# Patient Record
Sex: Female | Born: 1965
Health system: Southern US, Community
[De-identification: ages and names within clinical notes are randomized; demographics above are authoritative.]

## PROBLEM LIST (undated history)

## (undated) DIAGNOSIS — N6091 Unspecified benign mammary dysplasia of right breast: Secondary | ICD-10-CM

## (undated) DIAGNOSIS — F329 Major depressive disorder, single episode, unspecified: Secondary | ICD-10-CM

## (undated) DIAGNOSIS — F32A Depression, unspecified: Secondary | ICD-10-CM

## (undated) DIAGNOSIS — Z803 Family history of malignant neoplasm of breast: Secondary | ICD-10-CM

## (undated) DIAGNOSIS — F419 Anxiety disorder, unspecified: Secondary | ICD-10-CM

## (undated) DIAGNOSIS — M5030 Other cervical disc degeneration, unspecified cervical region: Secondary | ICD-10-CM

## (undated) DIAGNOSIS — E039 Hypothyroidism, unspecified: Secondary | ICD-10-CM

## (undated) DIAGNOSIS — Z973 Presence of spectacles and contact lenses: Secondary | ICD-10-CM

## (undated) DIAGNOSIS — R252 Cramp and spasm: Secondary | ICD-10-CM

## (undated) HISTORY — DX: Unspecified benign mammary dysplasia of right breast: N60.91

## (undated) HISTORY — PX: DILATION AND CURETTAGE OF UTERUS: SHX78

## (undated) HISTORY — PX: KNEE ARTHROSCOPY: SUR90

## (undated) HISTORY — PX: BACK SURGERY: SHX140

## (undated) HISTORY — PX: TONSILLECTOMY: SUR1361

## (undated) HISTORY — DX: Family history of malignant neoplasm of breast: Z80.3

## (undated) HISTORY — PX: TUBAL LIGATION: SHX77

---

## 1991-02-10 HISTORY — PX: CARPAL TUNNEL RELEASE: SHX101

## 1997-07-25 ENCOUNTER — Other Ambulatory Visit: Admission: RE | Admit: 1997-07-25 | Discharge: 1997-07-25 | Payer: Self-pay | Admitting: Obstetrics and Gynecology

## 1998-01-22 ENCOUNTER — Other Ambulatory Visit: Admission: RE | Admit: 1998-01-22 | Discharge: 1998-01-22 | Payer: Self-pay | Admitting: Obstetrics and Gynecology

## 2001-06-28 ENCOUNTER — Other Ambulatory Visit: Admission: RE | Admit: 2001-06-28 | Discharge: 2001-06-28 | Payer: Self-pay | Admitting: Obstetrics and Gynecology

## 2002-07-31 ENCOUNTER — Other Ambulatory Visit: Admission: RE | Admit: 2002-07-31 | Discharge: 2002-07-31 | Payer: Self-pay | Admitting: Obstetrics and Gynecology

## 2002-08-07 ENCOUNTER — Encounter: Payer: Self-pay | Admitting: Obstetrics and Gynecology

## 2002-08-07 ENCOUNTER — Encounter: Admission: RE | Admit: 2002-08-07 | Discharge: 2002-08-07 | Payer: Self-pay | Admitting: Obstetrics and Gynecology

## 2002-08-21 ENCOUNTER — Encounter: Admission: RE | Admit: 2002-08-21 | Discharge: 2002-08-21 | Payer: Self-pay | Admitting: Obstetrics and Gynecology

## 2002-08-21 ENCOUNTER — Encounter (INDEPENDENT_AMBULATORY_CARE_PROVIDER_SITE_OTHER): Payer: Self-pay | Admitting: Specialist

## 2002-08-21 ENCOUNTER — Encounter: Payer: Self-pay | Admitting: Obstetrics and Gynecology

## 2002-09-29 ENCOUNTER — Ambulatory Visit (HOSPITAL_COMMUNITY): Admission: RE | Admit: 2002-09-29 | Discharge: 2002-09-29 | Payer: Self-pay | Admitting: Obstetrics and Gynecology

## 2003-02-10 HISTORY — PX: CERVICAL FUSION: SHX112

## 2004-02-15 ENCOUNTER — Other Ambulatory Visit: Admission: RE | Admit: 2004-02-15 | Discharge: 2004-02-15 | Payer: Self-pay | Admitting: Obstetrics and Gynecology

## 2004-12-01 ENCOUNTER — Ambulatory Visit (HOSPITAL_COMMUNITY): Admission: RE | Admit: 2004-12-01 | Discharge: 2004-12-01 | Payer: Self-pay | Admitting: Obstetrics and Gynecology

## 2004-12-01 ENCOUNTER — Encounter (INDEPENDENT_AMBULATORY_CARE_PROVIDER_SITE_OTHER): Payer: Self-pay | Admitting: *Deleted

## 2005-03-23 ENCOUNTER — Other Ambulatory Visit: Admission: RE | Admit: 2005-03-23 | Discharge: 2005-03-23 | Payer: Self-pay | Admitting: Obstetrics and Gynecology

## 2006-02-09 HISTORY — PX: TOTAL VAGINAL HYSTERECTOMY: SHX2548

## 2006-07-27 ENCOUNTER — Ambulatory Visit (HOSPITAL_COMMUNITY): Admission: RE | Admit: 2006-07-27 | Discharge: 2006-07-28 | Payer: Self-pay | Admitting: Obstetrics and Gynecology

## 2006-07-27 ENCOUNTER — Encounter (INDEPENDENT_AMBULATORY_CARE_PROVIDER_SITE_OTHER): Payer: Self-pay | Admitting: Obstetrics and Gynecology

## 2006-08-06 ENCOUNTER — Inpatient Hospital Stay (HOSPITAL_COMMUNITY): Admission: AD | Admit: 2006-08-06 | Discharge: 2006-08-08 | Payer: Self-pay | Admitting: Obstetrics and Gynecology

## 2008-10-29 ENCOUNTER — Emergency Department (HOSPITAL_BASED_OUTPATIENT_CLINIC_OR_DEPARTMENT_OTHER): Admission: EM | Admit: 2008-10-29 | Discharge: 2008-10-29 | Payer: Self-pay | Admitting: Emergency Medicine

## 2008-10-29 ENCOUNTER — Ambulatory Visit: Payer: Self-pay | Admitting: Radiology

## 2010-05-16 LAB — DIFFERENTIAL
Lymphocytes Relative: 34 % (ref 12–46)
Lymphs Abs: 2.5 10*3/uL (ref 0.7–4.0)
Monocytes Relative: 6 % (ref 3–12)
Neutro Abs: 4.1 10*3/uL (ref 1.7–7.7)
Neutrophils Relative %: 56 % (ref 43–77)

## 2010-05-16 LAB — BASIC METABOLIC PANEL
Calcium: 9.3 mg/dL (ref 8.4–10.5)
Chloride: 105 mEq/L (ref 96–112)
Creatinine, Ser: 0.7 mg/dL (ref 0.4–1.2)
GFR calc Af Amer: >60 mL/min (ref >60)
GFR calc non Af Amer: >60 mL/min (ref >60)

## 2010-05-16 LAB — CBC
RBC: 4.03 MIL/uL (ref 3.87–5.11)
WBC: 7.3 10*3/uL (ref 4.0–10.5)

## 2010-05-16 LAB — URINALYSIS, ROUTINE W REFLEX MICROSCOPIC
Glucose, UA: NEGATIVE mg/dL
Hgb urine dipstick: NEGATIVE
Specific Gravity, Urine: 1.029 (ref 1.005–1.030)

## 2010-06-24 NOTE — Op Note (Signed)
NAMEELVIE, PALOMO NO.:  [id number]   MEDICAL RECORD NO.:  [id number]          PATIENT TYPE:  AMB   LOCATION:  SDC                           FACILITY:  [facility]   PHYSICIAN:  Juluis Mire, M.D.   DATE OF BIRTH:  May 12, 1965   DATE OF PROCEDURE:  07/27/2006  DATE OF DISCHARGE:                               OPERATIVE REPORT   PREOPERATIVE DIAGNOSIS:  Uterine fibroids.   POSTOPERATIVE DIAGNOSIS:  Uterine fibroids.   OPERATIVE PROCEDURE:  Laparoscopic-assisted vaginal hysterectomy.   SURGEON:  Juluis Mire, M.D.   ASSISTANT:  Candice Camp, M.D.   ANESTHESIA:  General.   ESTIMATED BLOOD LOSS:  300-400 mL.   PACKS AND DRAINS:  None.   INTRAOPERATIVE BLOOD REPLACED:  None.   COMPLICATIONS:  None.   INDICATIONS:  Dictated in the history and physical.   Procedure as follows:  The patient was taken to the OR and placed in the  supine position.  After satisfactory level of general tracheal  anesthesia was obtained, the patient was placed in dorsal spine position  using the Allen stirrups.  At this point in time, the abdomen, perineum  and vagina were prepped out with Betadine.  The bladder was emptied by  in-and-out catheterization.  A Hulka was put in place.  The patient was  then draped in sterile field.  A subumbilical incision was made with a  knife and extended through subcutaneous tissue.  The fascia was entered  sharply and incision in the fascia extended laterally.  Muscles were  separated in the midline.  Peritoneum was entered sharply, and an open  laparoscopic trocar was put in place and secured.  The abdomen was  insufflated with carbon dioxide.  Laparoscope was introduced.  There was  no evidence of injury to adjacent organs.  A 5-mm trocar was put in  place in the suprapubic area under direct visualization.  Visualization  revealed the uterus to be enlarged with multiple fibroids.  Tubes and  ovaries were unremarkable.  Appendix was visualized and  noted to be  normal.  Upper abdomen including liver and tip of the gallbladder were  clear.  At this point in time using the gyrus, the right utero-ovarian  pedicle was cauterized incised.  The right round ligament was cauterized  incised.  We then went to the left side where the left utero-ovarian  pedicle was cauterized incised and the left round ligament was  cauterized incised.  Both adnexa right were adequately freed.   At this point in time, the abdomen was deflated of carbon dioxide.  Laparoscope was removed.  The patient's legs repositioned.  The Hulka  tenaculum was then taken out.  A weighted speculum was placed in vaginal  vault.  The cervix was grasped with Christella Hartigan tenaculum.  Cul-de-sac was  entered sharply.  Reflection of the vaginal mucosa anteriorly was  incised using the Bovie.  Uterosacral ligaments were clamped, cut, and  suture ligated with 0 Vicryl.  The bladder was dissected superiorly.  Paracervical tissue was clamped, cut and suture ligated with 0 Vicryl.  Using a clamp,  cut and tie technique with suture ligature of 9 Vicryl,  the parametrium was serially separated from the sides of the uterus.  Retractor was put in anteriorly to reflect the bladder superiorly.  We  then morcellated the cervix and the lower cervical stump from its  attachment to the uterus.  We continued morcellation until the fundus  delivered through the vagina.  The remaining tissue was then clamped and  cut, and the remaining part of the uterus passed off of the operative  field.  Total weight was 380 grams.  Uterus and cervix were sent to  pathology.  Pedicles secured with free ties of 0 Vicryl.  Uterosacral  plication stitch of 0 Vicryl was put in place and secured.  The vaginal  mucosa was reapproximated in the midline with interrupted sutures of 0  Monocryl.  At this point in time, Foley was placed to straight drain.  Clear urine was obtained.   The patient's legs were repositioned.   Laparoscope was reintroduced.  Any areas of bleeding were brought under control using cautery.  We  thoroughly irrigated the pelvis.  Once we had documented adequate  hemostasis, the abdomen was deflated of its carbon dioxide.  All trocars  were removed.  Subumbilical fascia closed with 2 figure-of-eights of 0  Vicryl, skin with interrupted subcuticulars of 4-0 Vicryl.  Suprapubic  incision was then closed with Dermabond.  The patient taken out of  dorsal lithotomy position.  Once alert and extubated, transferred to the  recovery room in good condition.  Sponge, instrument and needle count  reported as correct by the circulating nurse x2.  Foley catheter  remained clear.      Juluis Mire, M.D.  Electronically Signed     JSM/MEDQ  D:  07/27/2006  T:  07/27/2006  Job:  [id number]

## 2010-06-24 NOTE — H&P (Signed)
[name], [name]               ACCOUNT NO.:  [id number]   MEDICAL RECORD NO.:  [id number]          PATIENT TYPE:  INP   LOCATION:  9399                          FACILITY:  [facility]   PHYSICIAN:  Guy Sandifer. Henderson Cloud, M.D. DATE OF BIRTH:  1965/11/03   DATE OF ADMISSION:  08/06/2006  DATE OF DISCHARGE:                              HISTORY & PHYSICAL   CHIEF COMPLAINT:  Postoperative fever.   HISTORY OF PRESENT ILLNESS:  This patient is a 45 year old, married,  white female, G2, P2 who is status post laparoscopically assisted  vaginal hysterectomy with Dr. Arelia Sneddon on July 27, 2006. The procedure was  without complication. The patient was discharged home in the usual  manner. She was seen in the office on August 03, 2006. She had some  vaginal discomfort and bloating but was afebrile. On exam, it was felt  she may have a small vaginal cuff hematoma. She was placed on oral  ciprofloxacin. Since that time, she has had temperatures of 102. She has  taken ibuprofen and Tylenol but the fever has been recurrent. She is  taking some liquids but is unable to eat food. She is having bowel  movements and voiding well. No vomiting. She was then evaluated in the  office today where here temperature was 102.5. Pulse was 120. Her  urinalysis had large ketones.   PAST MEDICAL HISTORY:  1. Hypothyroidism.  2. Cervical dysplasia.   PAST SURGICAL HISTORY:  1. Bilateral tubal ligation.  2. Hysteroscopy.   MEDICATIONS:  Synthroid 0.88 mg daily.   ALLERGIES:  The patient reports allergy to penicillin as a child with  rash and fever. However, she has taken Keflex without difficulty.   OBSTETRICAL HISTORY:  Vaginal delivery x2.   FAMILY HISTORY:  Noncontributory.   SOCIAL HISTORY:  Denies tobacco, alcohol or drug abuse.   REVIEW OF SYSTEMS:  The patient complains of shaking chills and fever as  above. Denies shortness of breath, cough or chest pain.   PHYSICAL EXAMINATION:  The patient is mildly  diaphoretic.  LUNGS:  Are clear to auscultation.  HEART:  Regular rate and rhythm.  BACK:  Is without CVA tenderness.  BREASTS:  Are not examined.  ABDOMEN:  Is soft with positive bowel sounds. She has mild suprapubic  tenderness without rebound.  PELVIC:  On bimanual exam, she is quite tender at the cuff, and there is  some mass effect suggesting a possible hematoma.  EXTREMITIES:  Are grossly within normal limits.  NEUROLOGIC EXAM:  Grossly within normal limits.   ASSESSMENT:  Postoperative fever. Possible cuff cellulitis or hematoma  of the vaginal cuff.   PLAN:  Will admit to the hospital. Begin IV fluids, IV antibiotics and  further evaluation.      Guy Sandifer Henderson Cloud, M.D.  Electronically Signed     JET/MEDQ  D:  08/06/2006  T:  08/06/2006  Job:  [id number]

## 2010-06-24 NOTE — Discharge Summary (Signed)
NAMEJAYLEN, CLAUDE               ACCOUNT NO.:  [id number]   MEDICAL RECORD NO.:  [id number]          PATIENT TYPE:  OIB   LOCATION:  9318                          FACILITY:  [facility]   PHYSICIAN:  Juluis Mire, M.D.   DATE OF BIRTH:  04/04/1965   DATE OF ADMISSION:  07/27/2006  DATE OF DISCHARGE:  07/28/2006                               DISCHARGE SUMMARY   ADMISSION DIAGNOSIS:  Uterine fibroids.   DISCHARGE DIAGNOSIS:  Uterine fibroids.   OPERATIVE PROCEDURE:  Laparoscopic-assisted vaginal hysterectomy.   For complete history and physical, see dictated note.   COURSE IN THE HOSPITAL:  The patient underwent above-noted procedure.  Did quite well, discharged home first postoperative day, at that time  tolerating a regular diet, voiding without difficulty. Her incisions  were clear. Bowel sounds were active. She had no active bleeding.  Postoperative hemoglobin 11.9.   In terms of complications, none were encountered during stay in the  hospital. The patient was discharged home in stable condition.   DISPOSITION:  The patient is avoid heavy lifting, vaginal entry, driving  a car. Watch for signs of infection, nausea/vomiting, increasing  abdominal pain, active vaginal bleeding, signs of deep vein thrombosis  or pulmonary embolus. Followup in the office will be in one week. Sent  home on Tylox as needed for pain.      Juluis Mire, M.D.  Electronically Signed     JSM/MEDQ  D:  07/28/2006  T:  07/28/2006  Job:  [id number]

## 2010-06-24 NOTE — H&P (Signed)
Michelle [name], MENA NO.:  [id number]   MEDICAL RECORD NO.:  [id number]          PATIENT TYPE:  AMB   LOCATION:  SDC                           [facility]ACILITY:  [facility]   PHYSICIAN:  Juluis Mire, M.D.   DATE OF BIRTH:  01-31-1966   DATE OF ADMISSION:  07/27/2006  DATE OF DISCHARGE:                              HISTORY & PHYSICAL   The patient is a 45 year old, gravida 2, para 2, married female,  presents for laparoscopic-assisted vaginal hysterectomy, possible  bilateral salpingo-oophorectomy.   In relation to present admission, the patient's cycles have been  relatively regular.  She has complained of increasing menorrhagia.  She  has been on birth control pills for a period of time without  improvement.  We did do an ultrasound evaluation.  She had multiple  fibroids, largest measuring 6.2 x 5.3.  Left ovary had 2.6-cm cyst.  Since she did respond to birth control pills, we have discussed various  options.  After discussion of options, the patient decided to proceed  with laparoscopic-assisted vaginal hysterectomy.   IN TERMS OF ALLERGIES, SHE IS ALLERGIC TO PENICILLIN.   MEDICATIONS:  Include Synthroid, birth control pills and vitamins.   PAST MEDICAL HISTORY:  Usual childhood diseases.  No significant  sequelae.  She is on Synthroid for hypothyroidism.  She has a history of  cervical dysplasia treated with cryotherapy in the past.  She had  previous bilateral tubal ligation in 2004.  She has also had a history  of hysteroscopy, resection of polyps in 2006.   OBSTETRICAL HISTORY:  She has had two vaginal deliveries.   FAMILY HISTORY:  Noncontributory.   SOCIAL HISTORY:  Reveals no tobacco or alcohol use.   REVIEW OF SYSTEMS:  Noncontributory.   PHYSICAL EXAMINATION:  The patient is afebrile with stable vital signs.  HEENT EXAM:  The patient is normocephalic.  Pupils equal, round and  reactive to light and accommodation.  Extraocular movements were intact.  Sclerae and conjunctivae are clear.  Oropharynx clear.  NECK:  Without thyromegaly.  BREASTS:  No discrete masses.  LUNGS:  Clear.  CARDIOVASCULAR SYSTEM:  Regular rate without murmurs or gallops.  ABDOMINAL EXAM:  Is benign.  No mass, organomegaly or tenderness.  PELVIC:  Normal external genitalia.  Vaginal mucosa clear.  Cervix  unremarkable.  Uterus enlarged with multiple fibroids, approximately 8-9  weeks.  Adnexa unremarkable.  EXTREMITIES:  Trace edema.  NEUROLOGIC EXAM:  Grossly within normal limits.   IMPRESSION:  Menorrhagia secondary to uterine fibroids.   PLAN:  The patient will undergo laparoscopic-assisted vaginal  hysterectomy.  Ovaries were left in place per patient's request as they  appeared to be normal.  Potential risk of malignant transformation have  been discussed.  The risks of surgery explained including the risk of  infection.  Risk of hemorrhage that could require transfusion with the  risk of AIDS or hepatitis.  Risk of injury to adjacent organs including  bladder, bowel, ureters that could require further exploratory surgery.  Risk of deep venous thrombosis and pulmonary emboli.  The patient  expressed understanding of  indications and risks.      Juluis Mire, M.D.  Electronically Signed     JSM/MEDQ  D:  07/27/2006  T:  07/27/2006  Job:  [id number]

## 2010-06-24 NOTE — Discharge Summary (Signed)
NAMEHETHER, ANSELMO               ACCOUNT NO.:  [id number]   MEDICAL RECORD NO.:  [id number]          PATIENT TYPE:  INP   LOCATION:  9308                          FACILITY:  [facility]   PHYSICIAN:  Guy Sandifer. Henderson Cloud, M.D. DATE OF BIRTH:  10-21-1965   DATE OF ADMISSION:  08/06/2006  DATE OF DISCHARGE:  08/08/2006                               DISCHARGE SUMMARY   ADMITTING DIAGNOSES:  1. Status post laparoscopically-assisted vaginal hysterectomy      approximately 2 weeks ago.  2. Fever.   DISCHARGE DIAGNOSES:  1. Status post laparoscopically-assisted vaginal hysterectomy      approximately 2 weeks ago.  2. Fever.   REASON FOR ADMISSION:  This patient is a 45 year old married white  female, status post laparoscopically-assisted vaginal hysterectomy  approximately 2 weeks prior to presentation.  She was evaluated in the  office approximately 3 to 4 days ago.  At that time, she had a probable  vaginal cuff hematoma that was palpated.  She was placed on Cipro,  although she was afebrile at that point in the office.  For the next  three nights, she had fevers of approximately 102 degrees.  They would  defervesce with antipyretics, but then return.  She was complaining of  body aches.  She was tolerating liquids but had decreased appetite.  She  was having flatus, bowel movements and voiding without difficulty.  On  evaluation, temperature was 102.5.  Hemoglobin 11.3, white count 20.4,  platelets 247,000, and neutrophils were 97.   HOSPITAL COURSE:  The patient admitted to hospital.  She underwent an  abdominopelvic CT, which revealed an approximately 6-cm mass of the  vaginal cuff, consistent with hematoma versus abscess.  She was placed  on intravenous Cefotan, which she tolerated well.  Temperature  defervesced over the first 24 hours.  On August 07, 2006, white count is  15.5, hemoglobin 10.0 and neutrophils are 86%.  She continued to improve  and on the day of admission has been  afebrile for greater than 24 hours.  She is feeling better, tolerating a regular diet and ambulating without  difficulty.  Urine and blood cultures are pending.   CONDITION ON DISCHARGE:  Good.  Diet regular as tolerated.   ACTIVITY:  No vaginal entry.  No lifting or operation of automobiles.  She is to call the office for problems including not limited to  temperature of 101 degrees, increasing pain or persistent nausea,  vomiting.   MEDICATIONS:  1. Levaquin 750 mg, #7 one p.o. daily.  2. Flagyl ER 750 mg, #7 one p.o. daily.  She can resume her pain meds that she was taking postoperatively as  needed.   FOLLOW-UP:  Is in the office tomorrow with Dr. Arelia Sneddon.      Guy Sandifer Henderson Cloud, M.D.  Electronically Signed     JET/MEDQ  D:  08/08/2006  T:  08/08/2006  Job:  [id number]

## 2010-06-27 NOTE — Op Note (Signed)
Michelle [name], Michelle [name]                         ACCOUNT NO.:  [id number]   MEDICAL RECORD NO.:  [id number]                   PATIENT TYPE:  AMB   LOCATION:  SDC                                  FACILITY:  [facility]   PHYSICIAN:  Juluis Mire, M.D.                DATE OF BIRTH:  April 28, 1965   DATE OF PROCEDURE:  09/29/2002  DATE OF DISCHARGE:                                 OPERATIVE REPORT   PREOPERATIVE DIAGNOSIS:  Multiparity, desires sterility.   POSTOPERATIVE DIAGNOSES:  1. Multiparity, desires sterility.  2. Endometriosis.   PROCEDURES:  1. Diagnostic laparoscopy.  2. Bilateral tubal fulguration.  3. Cautery of endometriotic implants.   SURGEON:  Juluis Mire, M.D.   ANESTHESIA:  General.   ESTIMATED BLOOD LOSS:  Minimal.   PACKS AND DRAINS:  None.   INTRAOPERATIVE BLOOD REPLACED:  None.   COMPLICATIONS:  None.   INDICATIONS:  Dictated in history and physical.   DESCRIPTION OF PROCEDURE:  Patient taken to the OR, placed in the supine  position.  After a satisfactory level of general endotracheal anesthesia  obtained, the patient was placed in the dorsal lithotomy position using the  Allen stirrups.  The abdomen, perineum, and vagina were prepped out with  Betadine.  The bladder was emptied by in-and-out catheterization.  A  speculum was placed in the vaginal vault.  The Hulka tenaculum was secured  and the speculum was then removed.  The patient draped out as a sterile  field.  A subumbilical incision made with a knife.  The Veress needle was  introduced into the abdominal cavity.  The abdomen was inflated with carbon  dioxide.  Then operating laparoscope was introduced.  There was no evidence  of injury to adjacent organs.  Visualization revealed the uterus to be upper  limits of normal size.  Tubes and ovaries were unremarkable.  She did have  an implant of endometriosis on the left pelvic sidewall cul-de-sac area.  The appendix was visualized and noted to be  normal.  The upper abdomen,  including the liver and tip of the gallbladder, was clear.  Using the  bipolar, a 3 cm segment of each fallopian tube was cauterized.  Cautery was  continued until resistance read 0 on the meter.  Coagulation did extend out  to the mesosalpinx.  The same segment of tube was re-coagulated to  completely desiccate the tube.  At the end of the procedure both tubes were  adequately coagulated and there was no evidence of injury to adjacent organs  or active bleeding.  The abdomen was deflated of its carbon dioxide, the  laparoscope and trocar were removed.  The  subumbilical incision closed with interrupted subcuticulars of 4-0 Vicryl.  The Hulka tenaculum was then removed and the patient taken out of the dorsal  lithotomy position, once alert and extubated transferred to the recovery  room in good condition.  Juluis Mire, M.D.    JSM/MEDQ  D:  09/29/2002  T:  09/29/2002  Job:  [id number]

## 2010-06-27 NOTE — H&P (Signed)
NAMEJARROD, BODKINS NO.:  [id number]   MEDICAL RECORD NO.:  [id number]          PATIENT TYPE:  AMB   LOCATION:                                [facility]ILITY[facility]   PHYSICIAN:  Juluis Mire, M.D.   DATE OF BIRTH:  December 23, 1965   DATE OF ADMISSION:  12/01/2004  DATE OF DISCHARGE:                                HISTORY & PHYSICAL   Patient is a 45 year old gravida 2, para 2 married female presents for  hysteroscopic evaluation.   In relation to the present admission patient has been having trouble with  polymenorrhea cycling every two weeks.  This has been going on for about six  to seven months.  This was consistent with anovulatory cycling and underwent  a saline infusion ultrasound with the finding of a large endometrial polyp  and/or fibroid.  She presents now for hysteroscopic resection.  She is also  noted on the ultrasound to have an intramural fibroid.   ALLERGIES:  PENICILLIN.   MEDICATIONS:  1.  Synthroid 88 mcg per day.  2.  Paxil.   PAST MEDICAL HISTORY:  Usual childhood diseases.  No significant sequelae.  Hypothyroidism on Synthroid.  Did have a history of cervical dysplasia  treated with cryo therapy in the past.  Only other surgeries she had her  tubes tied in 2004.  There was evidence of minimal pelvic endometriosis at  that time.   PAST OBSTETRICAL HISTORY:  She has had two vaginal deliveries.   FAMILY HISTORY:  Noncontributory.   SOCIAL HISTORY:  No tobacco or alcohol use.   REVIEW OF SYSTEMS:  Noncontributory.   PHYSICAL EXAMINATION:  VITAL SIGNS:  Patient is afebrile with stable vital  signs.  HEENT:  Patient normocephalic.  Pupils are equal, round, and reactive to  light and accommodation.  Extraocular movements are intact.  Sclerae and  conjunctivae clear.  Oropharynx clear.  NECK:  Without thyromegaly.  BREASTS:  No discreet masses.  LUNGS:  Clear.  CARDIOVASCULAR:  Regular rhythm and rate.  No murmurs or gallops.  ABDOMEN:   Benign.  No mass, organomegaly, or tenderness.  PELVIC:  Normal external genitalia.  Vaginal mucosa clear.  Cervix  unremarkable.  Uterus normal size, shape, and contour.  Adnexa free of mass  or tenderness.  EXTREMITIES:  Trace edema.  NEUROLOGIC:  Grossly within normal limits.   IMPRESSION:  1.  Anovulatory polymenorrhea with large endometrial polyp.  2.  Uterine fibroid.  3.  Pelvic endometriosis.   PLAN:  The patient will undergo hysteroscopic resection of polyp.  Pathology  will be determined.  Risks of surgery have been discussed including the risk  of infection, the risk of hemorrhage that could require transfusion with the  risk of AIDS or hepatitis, the risk of injury to adjacent organs requiring  exploratory surgery and possible hysterectomy, the risk of deep venous  thrombosis and pulmonary embolus.  Patient expressed understanding of  indications and risks.      Juluis Mire, M.D.  Electronically Signed     JSM/MEDQ  D:  12/01/2004  T:  12/01/2004  Job:  [id number]

## 2010-06-27 NOTE — Op Note (Signed)
Michelle [name], Michelle [name]               ACCOUNT NO.:  [id number]   MEDICAL RECORD NO.:  [id number]          PATIENT TYPE:  AMB   LOCATION:  SDC                           [facility]ILITY[facility]   PHYSICIAN:  Juluis Mire, M.D.   DATE OF BIRTH:  06-25-1965   DATE OF PROCEDURE:  12/01/2004  DATE OF DISCHARGE:                                 OPERATIVE REPORT   PREOPERATIVE DIAGNOSES:  1.  Abnormal uterine bleeding.  2.  Endometrial polyp.   POSTOPERATIVE DIAGNOSES:  1.  Abnormal uterine bleeding.  2.  Endometrial polyp.   OPERATION/PROCEDURE:  1.  Hysteroscopy resection of polyp.  2.  Multiple endometrial biopsies.  3.  Endometrial curettings.   SURGEON:  Juluis Mire, M.D.   ANESTHESIA:  Paracervical block and sedation.   ESTIMATED BLOOD LOSS:  Minimal.   PACKS AND DRAINS:  None.   INTRAOPERATIVE BLOOD REPLACED:  None.   COMPLICATIONS:  None.   INDICATIONS:  Dictated in history and physical.   DESCRIPTION OF PROCEDURE:  The patient was taken to the OR and placed in the  supine position.  After satisfactory level of sedation, he was placed in the  dorsal lithotomy position using the Allen stirrups.  The patient was draped  in the sterile field.  Speculum was placed in the vaginal vault.  The cervix  and vagina were cleansed with Betadine.  Paracervical block was instituted  using 1% Nesacaine.  Cervix was grasped with a single-tooth tenaculum.  Uterus was somewhat stenotic.  A cervical canal was slightly deviated to the  left.  Uterus was sounded to approximately 11 cm.  Cervix was serially  dilated with size 35 Pratt dilator.  Operative hysteroscope was introduced  and the intrauterine cavity was distended using sorbitol.  Visualization  revealed a polyp on the anterior wall.  This was resected and multiple  pieces sent for pathology view.  Endometrium was thick. Multiple biopsies  were obtained.  We also obtained endometrial curettings.  There was no signs  of active bleeding  or perforation.  Speculum and single-tooth tenaculum removed.  The patient was taken out of  the dorsal lithotomy position and once alert, was transferred to the  recovery room in good condition.  Sponge, instrument and needle counts  reported as correct by the circulating nurse.      Juluis Mire, M.D.  Electronically Signed     JSM/MEDQ  D:  12/01/2004  T:  12/01/2004  Job:  [id number]

## 2010-06-27 NOTE — H&P (Signed)
NAME:  Michelle [name], Michelle [name]                         ACCOUNT NO.:  [id number]   MEDICAL RECORD NO.:  [id number]                   PATIENT TYPE:  AMB   LOCATION:  SD[facility]                                  FACILITY:  [facility]   PHYSICIAN:  Juluis Mire, M.D.                DATE OF BIRTH:  [birth year]   DATE OF ADMISSION:  09/29/2002  DATE OF DISCHARGE:                                HISTORY & PHYSICAL   HISTORY OF PRESENT ILLNESS:  The patient is a 45 year old gravida 2, para 2,  married white female who presents for laparoscopic bilateral tubal ligation.   In relation to the present admission, the patient is presently on birth  control pills for birth control purposes and desires permanent  sterilization.  The potential irreversibility of sterilization is explained.  Alternatives again discussed.  A failure rate of 1/200 is quoted and can be  in the form of ectopic pregnancy requiring further surgical management.  The  patient expressed understanding of options, indications and desires to  proceed with permanent sterilization.   ALLERGIES:  PENICILLIN.   MEDICATIONS:  1. Synthroid.  2. Paxil.  3. Birth control pills.   PAST MEDICAL HISTORY:  Usual childhood diseases without any significant  sequelae.  Is hypothyroid on Synthroid, followed by Dr. Evlyn Kanner.   PAST SURGICAL HISTORY:  None.   OB HISTORY:  Two spontaneous vaginal deliveries.   FAMILY HISTORY:  Noncontributory.   SOCIAL HISTORY:  No tobacco or alcohol use.   REVIEW OF SYSTEMS:  Noncontributory.   PHYSICAL EXAMINATION:  VITAL SIGNS:  The patient is afebrile, stable vital  signs.  HEENT:  Normocephalic.  Pupils equal, round and reactive to light and  accomodation.  Extraocular muscles intact.  Sclerae and conjunctivae are  clear.  Oropharynx clear.  NECK:  Without thyromegaly.  BREAST:  No discrete masses.  LUNGS:  Clear.  HEART:  Regular rate and rhythm without murmurs or gallops.  ABDOMEN:  Benign.  No masses,  organomegaly or tenderness on pelvic.  GU:  Normal external genitalia.  Vaginal mucosa is clear. Cervix is  unremarkable.  Uterus normal size, shape and contour.  Adnexa:  Free of  masses or tenderness.  EXTREMITIES:  Trace edema.  NEUROLOGICAL:  Grossly within normal limits.   IMPRESSION:  Multiparity, desires sterility.   PLAN:  The patient to undergo laparoscopic bilateral tubal fulguration.  The  risks of surgery have been discussed including the risk of anesthetics, the  risk of hemorrhage that could require transfusion with the risk of AIDS or  hepatitis.  Risks of injury to adjacent organs could require further  exploratory surgery.  Risks of deep venous thrombosis and pulmonary embolus.  The patient expressed understanding indications and risks accepted them.  Juluis Mire, M.D.    JSM/MEDQ  D:  09/29/2002  T:  09/29/2002  Job:  [id number]

## 2010-11-26 LAB — DIFFERENTIAL
Basophils Absolute: 0
Basophils Relative: 0
Eosinophils Absolute: 0
Eosinophils Absolute: 0.3
Eosinophils Relative: 0
Eosinophils Relative: 0
Eosinophils Relative: 3
Lymphocytes Relative: 35
Lymphocytes Relative: 9 — ABNORMAL LOW
Lymphs Abs: 1.4
Lymphs Abs: 2.8
Monocytes Absolute: 0.2
Monocytes Absolute: 0.7
Monocytes Relative: 6
Neutro Abs: 13.3 — ABNORMAL HIGH
Neutro Abs: 19.8 — ABNORMAL HIGH
Neutrophils Relative %: 55
Neutrophils Relative %: 97 — ABNORMAL HIGH

## 2010-11-26 LAB — CBC
HCT: 29.2 — ABNORMAL LOW
HCT: 34.6 — ABNORMAL LOW
HCT: 40.6
Hemoglobin: 10 — ABNORMAL LOW
Hemoglobin: 11.3 — ABNORMAL LOW
Hemoglobin: 11.9 — ABNORMAL LOW
MCHC: 34.3
MCV: 92.4
MCV: 92.4
Platelets: 172
Platelets: 247
Platelets: 258
RBC: 4.4
RDW: 12.5
RDW: 12.6
WBC: 10.1
WBC: 15.5 — ABNORMAL HIGH
WBC: 8

## 2010-11-26 LAB — CULTURE, BLOOD (ROUTINE X 2)
Culture: NO GROWTH
Culture: NO GROWTH

## 2010-11-26 LAB — URINE CULTURE

## 2010-11-26 LAB — COMPREHENSIVE METABOLIC PANEL
ALT: 21
AST: 26
Albumin: 3.1 — ABNORMAL LOW
Alkaline Phosphatase: 83
Calcium: 8.8
GFR calc Af Amer: >60
Potassium: 3.5
Sodium: 136
Total Protein: 6.1

## 2014-02-23 ENCOUNTER — Other Ambulatory Visit: Payer: Self-pay | Admitting: Obstetrics and Gynecology

## 2014-02-26 LAB — CYTOLOGY - PAP

## 2014-02-27 ENCOUNTER — Other Ambulatory Visit: Payer: Self-pay | Admitting: Obstetrics and Gynecology

## 2014-02-27 DIAGNOSIS — R928 Other abnormal and inconclusive findings on diagnostic imaging of breast: Secondary | ICD-10-CM

## 2014-03-09 ENCOUNTER — Ambulatory Visit
Admission: RE | Admit: 2014-03-09 | Discharge: 2014-03-09 | Disposition: A | Discharge status: Home / Self Care | Payer: BLUE CROSS/BLUE SHIELD | Source: Ambulatory Visit | Attending: Obstetrics and Gynecology | Admitting: Obstetrics and Gynecology

## 2014-03-09 ENCOUNTER — Other Ambulatory Visit: Payer: Self-pay | Admitting: Obstetrics and Gynecology

## 2014-03-09 DIAGNOSIS — R921 Mammographic calcification found on diagnostic imaging of breast: Secondary | ICD-10-CM

## 2014-03-09 DIAGNOSIS — R928 Other abnormal and inconclusive findings on diagnostic imaging of breast: Secondary | ICD-10-CM

## 2014-03-13 ENCOUNTER — Ambulatory Visit
Admission: RE | Admit: 2014-03-13 | Discharge: 2014-03-13 | Disposition: A | Discharge status: Home / Self Care | Payer: BLUE CROSS/BLUE SHIELD | Source: Ambulatory Visit | Attending: Obstetrics and Gynecology | Admitting: Obstetrics and Gynecology

## 2014-03-13 ENCOUNTER — Other Ambulatory Visit: Payer: Self-pay | Admitting: Obstetrics and Gynecology

## 2014-03-13 DIAGNOSIS — R928 Other abnormal and inconclusive findings on diagnostic imaging of breast: Secondary | ICD-10-CM

## 2014-03-13 DIAGNOSIS — R921 Mammographic calcification found on diagnostic imaging of breast: Secondary | ICD-10-CM

## 2014-03-27 ENCOUNTER — Other Ambulatory Visit (INDEPENDENT_AMBULATORY_CARE_PROVIDER_SITE_OTHER): Payer: Self-pay | Admitting: General Surgery

## 2014-03-27 DIAGNOSIS — R920 Mammographic microcalcification found on diagnostic imaging of breast: Secondary | ICD-10-CM

## 2014-03-27 NOTE — H&P (Signed)
Michelle [name] 03/27/2014 8:49 AM Location: [facility] Patient #: [id number] DOB: 1965/03/31 Married / Language: English / Race: White Female  History of Present Illness [name] MD; 03/27/2014 9:41 AM) Patient words: breast follow up.  The patient is a 49 year old female who presents with a complaint of Breast problems. Patient is a 49 year old female who is referred for consultation by Dr. [name] for abnormal right mammogram with microcalcifications. She required bilateral needle breast biopsies around 12 years ago. She has not had any breast trauma. She has never palpated a breast mass. She does report recently feeling abnormal sensations in her right breast in addition to itchiness of her right nipple. She has a sister who developed breast cancer at age 61. She had menarche at age 28. Her first child was born when she was 58. She breast-fed her second child. She is not yet postmenopausal. She denies any other family history of cancer. The patient did undergo a core needle biopsy of this upper inner quadrant 3 mm lesion. The biopsy was positive for lobular carcinoma in situ and atypical lobular hyperplasia.    Other Problems Elbert Ewings, CMA; 03/27/2014 8:49 AM) Anxiety Disorder Back Pain Depression Oophorectomy Right. Other disease, cancer, significant illness Thyroid Disease  Past Surgical History Elbert Ewings, CMA; 03/27/2014 8:49 AM) Breast Biopsy Right. Hysterectomy (not due to cancer) - Partial Knee Surgery Right. Spinal Surgery - Neck  Diagnostic Studies History Elbert Ewings, CMA; 03/27/2014 8:49 AM) Colonoscopy never Mammogram within last year Pap Smear 1-5 years ago  Allergies Elbert Ewings, CMA; 03/27/2014 8:51 AM) Penicillin G Procaine *PENICILLINS*  Medication History Elbert Ewings, CMA; 03/27/2014 8:52 AM) BuPROPion HCl ER (XL) (300MG  Tablet ER 24HR, Oral) Active. Levothyroxine Sodium (125MCG Tablet, Oral) Active. Synthroid  (125MCG Tablet, Oral) Active. Wellbutrin XL (300MG  Tablet ER 24HR, Oral) Active. Multivitamins (Oral) Active.  Social History Elbert Ewings, [place]; 03/27/2014 8:49 AM) Alcohol use Occasional alcohol use. Caffeine use Coffee. No drug use Tobacco use Never smoker.  Family History Elbert Ewings, [place]; 03/27/2014 8:49 AM) Arthritis Mother. Breast Cancer Sister. Cerebrovascular Accident Father. Depression Mother. Diabetes Mellitus Father. Heart Disease Father. Hypertension Father, Mother. Respiratory Condition Father. Thyroid problems Mother.  Pregnancy / Birth History Elbert Ewings, CMA; 03/27/2014 8:49 AM) Age at menarche 37 years. Gravida 2 Maternal age 6-25 Para 2  Review of Systems Elbert Ewings CMA; 03/27/2014 8:49 AM) General Present- Weight Gain. Not Present- Appetite Loss, Chills, Fatigue, Fever, Night Sweats and Weight Loss. Skin Present- Dryness. Not Present- Change in Wart/Mole, Hives, Jaundice, New Lesions, Non-Healing Wounds, Rash and Ulcer. HEENT Present- Wears glasses/contact lenses. Not Present- Earache, Hearing Loss, Hoarseness, Nose Bleed, Oral Ulcers, Ringing in the Ears, Seasonal Allergies, Sinus Pain, Sore Throat, Visual Disturbances and Yellow Eyes. Breast Present- Breast Pain. Not Present- Breast Mass, Nipple Discharge and Skin Changes. Cardiovascular Present- Leg Cramps. Not Present- Chest Pain, Difficulty Breathing Lying Down, Palpitations, Rapid Heart Rate, Shortness of Breath and Swelling of Extremities. Gastrointestinal Present- Constipation. Not Present- Abdominal Pain, Bloating, Bloody Stool, Change in Bowel Habits, Chronic diarrhea, Difficulty Swallowing, Excessive gas, Gets full quickly at meals, Hemorrhoids, Indigestion, Nausea, Rectal Pain and Vomiting. Musculoskeletal Present- Back Pain, Joint Pain, Joint Stiffness and Swelling of Extremities. Not Present- Muscle Pain and Muscle Weakness. Neurological Present- Headaches and Tingling. Not  Present- Decreased Memory, Fainting, Numbness, Seizures, Tremor, Trouble walking and Weakness. Psychiatric Present- Anxiety and Depression. Not Present- Bipolar, Change in Sleep Pattern, Fearful and Frequent crying. Endocrine Not Present- Cold Intolerance, Excessive Hunger,  Hair Changes, Heat Intolerance, Hot flashes and New Diabetes. Hematology Not Present- Easy Bruising, Excessive bleeding, Gland problems, HIV and Persistent Infections.   Vitals Elbert Ewings CMA; 03/27/2014 8:53 AM) 03/27/2014 8:52 AM Weight: 204.2 lb Height: 65in Body Surface Area: 2.06 m Body Mass Index: 33.98 kg/m Temp.: 96.59F  Pulse: 80 (Regular)  Resp.: 16 (Unlabored)  BP: 130/78 (Sitting, Left Arm, Standard)    Physical Exam [name] MD; 03/27/2014 9:42 AM) General Mental Status-Alert. General Appearance-Consistent with stated age. Hydration-Well hydrated. Voice-Normal.  Head and Neck Head-normocephalic, atraumatic with no lesions or palpable masses. Trachea-midline. Thyroid Gland Characteristics - normal size and consistency.  Eye Eyeball - Bilateral-Extraocular movements intact. Sclera/Conjunctiva - Bilateral-No scleral icterus.  Chest and Lung Exam Chest and lung exam reveals -quiet, even and easy respiratory effort with no use of accessory muscles and on auscultation, normal breath sounds, no adventitious sounds and normal vocal resonance. Inspection Chest Wall - Normal. Back - normal.  Breast Note: Patient has no palpable masses in either breast. There is no axillary lymphadenopathy. Breasts are symmetric bilaterally. The breasts have grade 1 ptosis. There is tenderness at the site of biopsy on the right upper inner quadrant. Breasts are relatively dense bilaterally.   Cardiovascular Cardiovascular examination reveals -normal heart sounds, regular rate and rhythm with no murmurs and normal pedal pulses bilaterally.  Abdomen Inspection Inspection of the  abdomen reveals - No Hernias. Palpation/Percussion Palpation and Percussion of the abdomen reveal - Soft, Non Tender, No Rebound tenderness, No Rigidity (guarding) and No hepatosplenomegaly. Auscultation Auscultation of the abdomen reveals - Bowel sounds normal.  Neurologic Neurologic evaluation reveals -alert and oriented x 3 with no impairment of recent or remote memory. Mental Status-Normal.  Musculoskeletal Global Assessment -Note: no gross deformities.  Normal Exam - Left-Upper Extremity Strength Normal and Lower Extremity Strength Normal. Normal Exam - Right-Upper Extremity Strength Normal and Lower Extremity Strength Normal.  Lymphatic Head & Neck  General Head & Neck Lymphatics: Bilateral - Description - Normal. Axillary  General Axillary Region: Bilateral - Description - Normal. Tenderness - Non Tender. Femoral & Inguinal  Generalized Femoral & Inguinal Lymphatics: Bilateral - Description - No Generalized lymphadenopathy.    Assessment & Plan [name] MD; 03/27/2014 9:44 AM) ABNORMAL MAMMOGRAM WITH MICROCALCIFICATION (793.81  R92.0) Impression: The patient will need a excisional biopsy of the right abnormal lesion. I reviewed the rationale for this. I discussed that we would need to have a seed placed in order to perform the surgery.  She will also need to be referred to oncology no matter what the result is since she has atypical lobular hyperplasia and a sister with breast cancer. If she does indeed have cancer, she will need to see genetics.  I discussed the risk of surgery including bleeding, infection, pain, possible need for additional surgery or procedures, and wound issues. She would like to have the surgery toward the middle or end of the week in order to minimize her time off work. She works in the front office of a dental clinic.  Of note, the pathology report does incorrectly state that this is a left core needle biopsy. All of her images, her  bruise, and biopsy site or on the right. We have asked the breast center and the pathology department to correct this oversight. Current Plans  Schedule for Surgery Pt Education - [facility] Breast Biopsy Instructions (FLB): discussed with patient and provided information.   Signed by [name], MD (03/27/2014 9:45 AM)

## 2014-03-29 ENCOUNTER — Encounter (HOSPITAL_BASED_OUTPATIENT_CLINIC_OR_DEPARTMENT_OTHER): Payer: Self-pay | Admitting: *Deleted

## 2014-03-29 NOTE — Progress Notes (Signed)
No labs needed

## 2014-03-30 ENCOUNTER — Ambulatory Visit
Admission: RE | Admit: 2014-03-30 | Discharge: 2014-03-30 | Disposition: A | Discharge status: Home / Self Care | Payer: BLUE CROSS/BLUE SHIELD | Source: Ambulatory Visit | Attending: General Surgery | Admitting: General Surgery

## 2014-03-30 DIAGNOSIS — R920 Mammographic microcalcification found on diagnostic imaging of breast: Secondary | ICD-10-CM

## 2014-04-03 HISTORY — PX: BREAST BIOPSY: SHX20

## 2014-04-04 ENCOUNTER — Encounter (HOSPITAL_BASED_OUTPATIENT_CLINIC_OR_DEPARTMENT_OTHER): Payer: Self-pay | Admitting: Anesthesiology

## 2014-04-04 ENCOUNTER — Ambulatory Visit (HOSPITAL_BASED_OUTPATIENT_CLINIC_OR_DEPARTMENT_OTHER)
Admission: RE | Admit: 2014-04-04 | Discharge: 2014-04-04 | Disposition: A | Discharge status: Home / Self Care | Payer: BLUE CROSS/BLUE SHIELD | Source: Ambulatory Visit | Attending: General Surgery | Admitting: General Surgery

## 2014-04-04 ENCOUNTER — Ambulatory Visit (HOSPITAL_BASED_OUTPATIENT_CLINIC_OR_DEPARTMENT_OTHER): Payer: BLUE CROSS/BLUE SHIELD | Admitting: Anesthesiology

## 2014-04-04 ENCOUNTER — Encounter (HOSPITAL_BASED_OUTPATIENT_CLINIC_OR_DEPARTMENT_OTHER)
Admission: RE | Disposition: A | Discharge status: Home / Self Care | Payer: Self-pay | Source: Ambulatory Visit | Attending: General Surgery

## 2014-04-04 ENCOUNTER — Ambulatory Visit
Admission: RE | Admit: 2014-04-04 | Discharge: 2014-04-04 | Disposition: A | Discharge status: Home / Self Care | Payer: BLUE CROSS/BLUE SHIELD | Source: Ambulatory Visit | Attending: General Surgery | Admitting: General Surgery

## 2014-04-04 DIAGNOSIS — F419 Anxiety disorder, unspecified: Secondary | ICD-10-CM | POA: Diagnosis not present

## 2014-04-04 DIAGNOSIS — Z803 Family history of malignant neoplasm of breast: Secondary | ICD-10-CM | POA: Diagnosis not present

## 2014-04-04 DIAGNOSIS — Z6833 Body mass index (BMI) 33.0-33.9, adult: Secondary | ICD-10-CM | POA: Diagnosis not present

## 2014-04-04 DIAGNOSIS — N6091 Unspecified benign mammary dysplasia of right breast: Secondary | ICD-10-CM | POA: Insufficient documentation

## 2014-04-04 DIAGNOSIS — Z880 Allergy status to penicillin: Secondary | ICD-10-CM | POA: Insufficient documentation

## 2014-04-04 DIAGNOSIS — D241 Benign neoplasm of right breast: Secondary | ICD-10-CM | POA: Diagnosis not present

## 2014-04-04 DIAGNOSIS — F329 Major depressive disorder, single episode, unspecified: Secondary | ICD-10-CM | POA: Diagnosis not present

## 2014-04-04 DIAGNOSIS — R920 Mammographic microcalcification found on diagnostic imaging of breast: Secondary | ICD-10-CM

## 2014-04-04 DIAGNOSIS — E039 Hypothyroidism, unspecified: Secondary | ICD-10-CM | POA: Insufficient documentation

## 2014-04-04 HISTORY — DX: Anxiety disorder, unspecified: F41.9

## 2014-04-04 HISTORY — DX: Hypothyroidism, unspecified: E03.9

## 2014-04-04 HISTORY — PX: RADIOACTIVE SEED GUIDED EXCISIONAL BREAST BIOPSY: SHX6490

## 2014-04-04 HISTORY — DX: Depression, unspecified: F32.A

## 2014-04-04 HISTORY — DX: Major depressive disorder, single episode, unspecified: F32.9

## 2014-04-04 HISTORY — DX: Presence of spectacles and contact lenses: Z97.3

## 2014-04-04 HISTORY — DX: Other cervical disc degeneration, unspecified cervical region: M50.30

## 2014-04-04 SURGERY — RADIOACTIVE SEED GUIDED BREAST BIOPSY
Anesthesia: General | Site: Breast | Laterality: Right

## 2014-04-04 MED ORDER — HYDROMORPHONE HCL 1 MG/ML IJ SOLN: 0.2500 mg | INTRAMUSCULAR | Status: DC | PRN

## 2014-04-04 MED ORDER — PROMETHAZINE HCL 25 MG/ML IJ SOLN
6.2500 mg | INTRAMUSCULAR | Status: DC | PRN
Start: 2014-04-04 — End: 2014-04-04

## 2014-04-04 MED ORDER — MIDAZOLAM HCL 5 MG/5ML IJ SOLN
INTRAMUSCULAR | Status: DC | PRN
  Administered 2014-04-04: 2 mg via INTRAVENOUS

## 2014-04-04 MED ORDER — CEFAZOLIN SODIUM-DEXTROSE 2-3 GM-% IV SOLR
INTRAVENOUS | Status: AC
  Filled 2014-04-04: qty 50

## 2014-04-04 MED ORDER — PROPOFOL 10 MG/ML IV BOLUS
INTRAVENOUS | Status: AC
  Filled 2014-04-04: qty 80

## 2014-04-04 MED ORDER — MIDAZOLAM HCL 2 MG/2ML IJ SOLN
INTRAMUSCULAR | Status: AC
  Filled 2014-04-04: qty 2

## 2014-04-04 MED ORDER — FENTANYL CITRATE 0.05 MG/ML IJ SOLN
INTRAMUSCULAR | Status: DC | PRN
  Administered 2014-04-04 (×4): 50 ug via INTRAVENOUS

## 2014-04-04 MED ORDER — LIDOCAINE HCL (CARDIAC) 20 MG/ML IV SOLN
INTRAVENOUS | Status: DC | PRN
  Administered 2014-04-04: 100 mg via INTRAVENOUS

## 2014-04-04 MED ORDER — OXYCODONE HCL 5 MG PO TABS: 5.0000 mg | ORAL_TABLET | Freq: Once | ORAL | Status: DC | PRN

## 2014-04-04 MED ORDER — PROPOFOL 10 MG/ML IV BOLUS
INTRAVENOUS | Status: DC | PRN
  Administered 2014-04-04: 200 mg via INTRAVENOUS

## 2014-04-04 MED ORDER — PROPOFOL 10 MG/ML IV EMUL
INTRAVENOUS | Status: AC
  Filled 2014-04-04: qty 50

## 2014-04-04 MED ORDER — ONDANSETRON HCL 4 MG/2ML IJ SOLN
INTRAMUSCULAR | Status: DC | PRN
Start: 2014-04-04 — End: 2014-04-04
  Administered 2014-04-04: 4 mg via INTRAVENOUS

## 2014-04-04 MED ORDER — BUPIVACAINE-EPINEPHRINE (PF) 0.25% -1:200000 IJ SOLN
INTRAMUSCULAR | Status: AC
Start: 2014-04-04 — End: 2014-04-04
  Filled 2014-04-04: qty 30

## 2014-04-04 MED ORDER — SUCCINYLCHOLINE CHLORIDE 20 MG/ML IJ SOLN
INTRAMUSCULAR | Status: AC
  Filled 2014-04-04: qty 2

## 2014-04-04 MED ORDER — OXYCODONE-ACETAMINOPHEN 5-325 MG PO TABS: 1.0000 | ORAL_TABLET | ORAL | Status: DC | PRN

## 2014-04-04 MED ORDER — CEFAZOLIN SODIUM-DEXTROSE 2-3 GM-% IV SOLR
INTRAVENOUS | Status: DC | PRN
  Administered 2014-04-04: 2 g via INTRAVENOUS

## 2014-04-04 MED ORDER — DEXAMETHASONE SODIUM PHOSPHATE 4 MG/ML IJ SOLN
INTRAMUSCULAR | Status: DC | PRN
  Administered 2014-04-04: 10 mg via INTRAVENOUS

## 2014-04-04 MED ORDER — LACTATED RINGERS IV SOLN
INTRAVENOUS | Status: DC | PRN
  Administered 2014-04-04 (×2): via INTRAVENOUS

## 2014-04-04 MED ORDER — FENTANYL CITRATE 0.05 MG/ML IJ SOLN
INTRAMUSCULAR | Status: AC
  Filled 2014-04-04: qty 6

## 2014-04-04 MED ORDER — OXYCODONE HCL 5 MG/5ML PO SOLN: 5.0000 mg | Freq: Once | ORAL | Status: DC | PRN

## 2014-04-04 MED ORDER — LIDOCAINE HCL 1 % IJ SOLN
INTRAMUSCULAR | Status: DC | PRN
  Administered 2014-04-04: 20 mL

## 2014-04-04 MED ORDER — LIDOCAINE HCL (PF) 1 % IJ SOLN
INTRAMUSCULAR | Status: AC
Start: 2014-04-04 — End: 2014-04-04
  Filled 2014-04-04: qty 30

## 2014-04-04 SURGICAL SUPPLY — 51 items
BLADE HEX COATED 2.75 (ELECTRODE) ×3 IMPLANT
BLADE SURG 10 STRL SS (BLADE) ×2 IMPLANT
BLADE SURG 15 STRL LF DISP TIS (BLADE) ×1 IMPLANT
BLADE SURG 15 STRL SS (BLADE) ×3
CANISTER SUC SOCK COL 7IN (MISCELLANEOUS) IMPLANT
CANISTER SUCT 1200ML W/VALVE (MISCELLANEOUS) ×3 IMPLANT
CHLORAPREP W/TINT 26ML (MISCELLANEOUS) ×3 IMPLANT
CLIP TI LARGE 6 (CLIP) IMPLANT
CLOSURE WOUND 1/2 X4 (GAUZE/BANDAGES/DRESSINGS) ×1
COVER BACK TABLE 60X90IN (DRAPES) ×3 IMPLANT
COVER MAYO STAND STRL (DRAPES) ×3 IMPLANT
COVER PROBE W GEL 5X96 (DRAPES) ×3 IMPLANT
DECANTER SPIKE VIAL GLASS SM (MISCELLANEOUS) IMPLANT
DEVICE DUBIN W/COMP PLATE 8390 (MISCELLANEOUS) ×3 IMPLANT
DRAPE LAPAROSCOPIC ABDOMINAL (DRAPES) ×3 IMPLANT
DRAPE UTILITY XL STRL (DRAPES) ×3 IMPLANT
ELECT REM PT RETURN 9FT ADLT (ELECTROSURGICAL) ×3
ELECTRODE REM PT RTRN 9FT ADLT (ELECTROSURGICAL) ×1 IMPLANT
GAUZE SPONGE 4X4 12PLY STRL (GAUZE/BANDAGES/DRESSINGS) ×6 IMPLANT
GLOVE BIO SURGEON STRL SZ 6 (GLOVE) ×3 IMPLANT
GLOVE BIOGEL PI IND STRL 6.5 (GLOVE) ×1 IMPLANT
GLOVE BIOGEL PI IND STRL 7.0 (GLOVE) IMPLANT
GLOVE BIOGEL PI INDICATOR 6.5 (GLOVE) ×4
GLOVE BIOGEL PI INDICATOR 7.0 (GLOVE) ×2
GOWN STRL REUS W/ TWL LRG LVL3 (GOWN DISPOSABLE) ×1 IMPLANT
GOWN STRL REUS W/TWL 2XL LVL3 (GOWN DISPOSABLE) ×3 IMPLANT
GOWN STRL REUS W/TWL LRG LVL3 (GOWN DISPOSABLE) ×3
KIT MARKER MARGIN INK (KITS) ×1 IMPLANT
LIQUID BAND (GAUZE/BANDAGES/DRESSINGS) ×2 IMPLANT
NDL HYPO 25X1 1.5 SAFETY (NEEDLE) ×1 IMPLANT
NEEDLE HYPO 25X1 1.5 SAFETY (NEEDLE) ×3 IMPLANT
NS IRRIG 1000ML POUR BTL (IV SOLUTION) ×3 IMPLANT
PACK BASIN DAY SURGERY FS (CUSTOM PROCEDURE TRAY) ×3 IMPLANT
PENCIL BUTTON HOLSTER BLD 10FT (ELECTRODE) ×3 IMPLANT
SLEEVE SCD COMPRESS KNEE MED (MISCELLANEOUS) ×3 IMPLANT
SPONGE GAUZE 4X4 12PLY STER LF (GAUZE/BANDAGES/DRESSINGS) ×3 IMPLANT
SPONGE LAP 18X18 X RAY DECT (DISPOSABLE) ×3 IMPLANT
STAPLER VISISTAT 35W (STAPLE) IMPLANT
STRIP CLOSURE SKIN 1/2X4 (GAUZE/BANDAGES/DRESSINGS) ×2 IMPLANT
SUT MON AB 4-0 PC3 18 (SUTURE) ×3 IMPLANT
SUT SILK 2 0 SH (SUTURE) ×2 IMPLANT
SUT VIC AB 2-0 SH 18 (SUTURE) ×3 IMPLANT
SUT VIC AB 3-0 SH 27 (SUTURE) ×3
SUT VIC AB 3-0 SH 27X BRD (SUTURE) ×1 IMPLANT
SYR BULB 3OZ (MISCELLANEOUS) ×3 IMPLANT
SYR CONTROL 10ML LL (SYRINGE) ×3 IMPLANT
TOWEL OR 17X24 6PK STRL BLUE (TOWEL DISPOSABLE) ×3 IMPLANT
TOWEL OR NON WOVEN STRL DISP B (DISPOSABLE) ×3 IMPLANT
TUBE CONNECTING 20'X1/4 (TUBING) ×1
TUBE CONNECTING 20X1/4 (TUBING) ×2 IMPLANT
YANKAUER SUCT BULB TIP NO VENT (SUCTIONS) ×3 IMPLANT

## 2014-04-04 NOTE — Anesthesia Postprocedure Evaluation (Signed)
Anesthesia Post Note  Patient: Michelle [name]  Procedure(s) Performed: Procedure(s) (LRB): RADIOACTIVE SEED GUIDED EXCISIONAL RIGHT BREAST BIOPSY (Right)  Anesthesia type: general  Patient location: PACU  Post pain: Pain level controlled  Post assessment: Patient's Cardiovascular Status Stable  Last Vitals:  Filed Vitals:   04/04/14 1000  BP: 135/69  Pulse: 89  Temp:   Resp: 17    Post vital signs: Reviewed and stable  Level of consciousness: sedated  Complications: No apparent anesthesia complications

## 2014-04-04 NOTE — Op Note (Signed)
Right Breast Radioactive seed localized excisional biopsy  Indications: This patient presents with history of abnormal mammogram with microcalcifications.    Pre-operative Diagnosis: see above  Post-operative Diagnosis: same  Surgeon: [name]   Anesthesia: General endotracheal anesthesia  ASA Class: 2  Procedure Details  The patient was seen in the Holding Room. The risks, benefits, complications, treatment options, and expected outcomes were discussed with the patient. The possibilities of bleeding, infection, the need for additional procedures, failure to diagnose a condition, and creating a complication requiring transfusion or operation were discussed with the patient. The patient concurred with the proposed plan, giving informed consent.  The site of surgery properly noted/marked. The patient was taken to Operating Room # 5, identified, and the procedure verified as right Breast excisional biopsy. A Time Out was held and the above information confirmed.  The right arm, breast, and chest were prepped and draped in standard fashion. The lumpectomy was performed by creating an transverse incision over the upper inner quadrant of the breast over the previously placed radioactive seed.  Dissection was carried down to around the point of maximum signal intensity. The cautery was used to perform the dissection.  Hemostasis was achieved with cautery. The specimen was marked with a stitch.    Specimen radiography confirmed inclusion of the seed, but not the clip.  An additional superior margin was taken and had the clip in it.  This was also marked with a stitch.    The background signal in the breast was zero.  The wound was irrigated and closed with 3-0 vicryl in layers and 4-0 monocryl subcuticular suture.      Sterile dressings were applied. At the end of the operation, all sponge, instrument, and needle counts were correct.  Findings: grossly clear surgical margins and no  adenopathy  Estimated Blood Loss:  min         Specimens: right breast excisional biopsy and superior margin.           Complications:  None; patient tolerated the procedure well.         Disposition: PACU - hemodynamically stable.         Condition: stable

## 2014-04-04 NOTE — Anesthesia Preprocedure Evaluation (Addendum)
Anesthesia Evaluation  Patient identified by MRN, date of birth, ID band Patient awake    Reviewed: Allergy & Precautions, H&P , NPO status , Patient's Chart, lab work & pertinent test results  Airway Mallampati: II  TM Distance: >3 FB Neck ROM: full    Dental  (+) Teeth Intact, Dental Advidsory Given   Pulmonary neg pulmonary ROS,    Pulmonary exam normal       Cardiovascular negative cardio ROS      Neuro/Psych PSYCHIATRIC DISORDERS Anxiety Depression negative neurological ROS     GI/Hepatic negative GI ROS, Neg liver ROS,   Endo/Other  Hypothyroidism Morbid obesity  Renal/GU negative Renal ROS     Musculoskeletal  (+) Arthritis -,   Abdominal Normal abdominal exam  (+)   Peds  Hematology   Anesthesia Other Findings   Reproductive/Obstetrics                           Anesthesia Physical Anesthesia Plan  ASA: II  Anesthesia Plan: General   Post-op Pain Management:    Induction: Intravenous  Airway Management Planned: LMA  Additional Equipment:   Intra-op Plan:   Post-operative Plan: Extubation in OR  Informed Consent: I have reviewed the patients History and Physical, chart, labs and discussed the procedure including the risks, benefits and alternatives for the proposed anesthesia with the patient or authorized representative who has indicated his/her understanding and acceptance.   Dental Advisory Given  Plan Discussed with: Anesthesiologist, CRNA and Surgeon  Anesthesia Plan Comments:        Anesthesia Quick Evaluation

## 2014-04-04 NOTE — Interval H&P Note (Signed)
History and Physical Interval Note:  04/04/2014 8:23 AM  Michelle [name]  has presented today for surgery, with the diagnosis of RIGHT BREAST ABNORMAL CALCIFICATIONS ON MAMMOGRAM  The various methods of treatment have been discussed with the patient and family. After consideration of risks, benefits and other options for treatment, the patient has consented to  Procedure(s): RADIOACTIVE SEED GUIDED EXCISIONAL RIGHT BREAST BIOPSY (Right) as a surgical intervention .  The patient's history has been reviewed, patient examined, no change in status, stable for surgery.  I have reviewed the patient's chart and labs.  Questions were answered to the patient's satisfaction.     Daemien Fronczak

## 2014-04-04 NOTE — Discharge Instructions (Addendum)
[facility] °Office Phone Number [phone] ° °BREAST BIOPSY/ PARTIAL MASTECTOMY: POST OP INSTRUCTIONS ° °Always review your discharge instruction sheet given to you by the facility where your surgery was performed. ° °IF YOU HAVE DISABILITY OR FAMILY LEAVE FORMS, YOU MUST BRING THEM TO THE OFFICE FOR PROCESSING.  DO NOT GIVE THEM TO YOUR DOCTOR. ° °1. A prescription for pain medication may be given to you upon discharge.  Take your pain medication as prescribed, if needed.  If narcotic pain medicine is not needed, then you may take acetaminophen (Tylenol) or ibuprofen (Advil) as needed. °2. Take your usually prescribed medications unless otherwise directed °3. If you need a refill on your pain medication, please contact your pharmacy.  They will contact our office to request authorization.  Prescriptions will not be filled after 5pm or on week-ends. °4. You should eat very light the first 24 hours after surgery, such as soup, crackers, pudding, etc.  Resume your normal diet the day after surgery. °5. Most patients will experience some swelling and bruising in the breast.  Ice packs and a good support bra will help.  Swelling and bruising can take several days to resolve.  °6. It is common to experience some constipation if taking pain medication after surgery.  Increasing fluid intake and taking a stool softener will usually help or prevent this problem from occurring.  A mild laxative (Milk of Magnesia or Miralax) should be taken according to package directions if there are no bowel movements after 48 hours. °7. Unless discharge instructions indicate otherwise, you may remove your bandages 48 hours after surgery, and you may shower at that time.  You may have steri-strips (small skin tapes) in place directly over the incision.  These strips should be left on the skin for 7-10 days.   Any sutures or staples will be removed at the office during your follow-up visit. °8. ACTIVITIES:  You may resume  regular daily activities (gradually increasing) beginning the next day.  Wearing a good support bra or sports bra (or the breast binder) minimizes pain and swelling.  You may have sexual intercourse when it is comfortable. °a. You may drive when you no longer are taking prescription pain medication, you can comfortably wear a seatbelt, and you can safely maneuver your car and apply brakes. °b. RETURN TO WORK:  __________1 week_______________ °9. You should see your doctor in the office for a follow-up appointment approximately two weeks after your surgery.  Your doctor’s nurse will typically make your follow-up appointment when she calls you with your pathology report.  Expect your pathology report 2-3 business days after your surgery.  You may call to check if you do not hear from us after three days. ° ° °WHEN TO CALL YOUR DOCTOR: °1. Fever over 101.0 °2. Nausea and/or vomiting. °3. Extreme swelling or bruising. °4. Continued bleeding from incision. °5. Increased pain, redness, or drainage from the incision. ° °The clinic staff is available to answer your questions during regular business hours.  Please don’t hesitate to call and ask to speak to one of the nurses for clinical concerns.  If you have a medical emergency, go to the nearest emergency room or call 911.  A surgeon from [facility] is always on call at the hospital. ° °For further questions, please visit [email or web address]  ° ° °Post Anesthesia Home Care Instructions ° °Activity: °Get plenty of rest for the remainder of the day. A responsible adult should stay with you for 24   hours following the procedure.  °For the next 24 hours, DO NOT: °-Drive a car °-Operate machinery °-Drink alcoholic beverages °-Take any medication unless instructed by your physician °-Make any legal decisions or sign important papers. ° °Meals: °Start with liquid foods such as gelatin or soup. Progress to regular foods as tolerated. Avoid greasy, spicy, heavy  foods. If nausea and/or vomiting occur, drink only clear liquids until the nausea and/or vomiting subsides. Call your physician if vomiting continues. ° °Special Instructions/Symptoms: °Your throat may feel dry or sore from the anesthesia or the breathing tube placed in your throat during surgery. If this causes discomfort, gargle with warm salt water. The discomfort should disappear within 24 hours. ° ° °

## 2014-04-04 NOTE — Transfer of Care (Signed)
Immediate Anesthesia Transfer of Care Note  Patient: Michelle [name]  Procedure(s) Performed: Procedure(s): RADIOACTIVE SEED GUIDED EXCISIONAL RIGHT BREAST BIOPSY (Right)  Patient Location: PACU  Anesthesia Type:General  Level of Consciousness: awake and patient cooperative  Airway & Oxygen Therapy: Patient Spontanous Breathing and Patient connected to face mask oxygen  Post-op Assessment: Report given to RN and Post -op Vital signs reviewed and stable  Post vital signs: Reviewed and stable  Last Vitals:  Filed Vitals:   04/04/14 0929  BP: 138/119  Pulse:   Temp:   Resp:     Complications: No apparent anesthesia complications

## 2014-04-04 NOTE — H&P (View-Only) (Signed)
Michelle [name] 03/27/2014 8:49 AM Location: [facility] Patient #: [id number] DOB: [birth year] Married / Language: English / Race: White Female  History of Present Illness Michelle Klein MD; 03/27/2014 9:41 AM) Patient words: breast follow up.  The patient is a 49 year old female who presents with a complaint of Breast problems. Patient is a 49 year old female who is referred for consultation by Dr. [name] for abnormal right mammogram with microcalcifications. She required bilateral needle breast biopsies around 12 years ago. She has not had any breast trauma. She has never palpated a breast mass. She does report recently feeling abnormal sensations in her right breast in addition to itchiness of her right nipple. She has a sister who developed breast cancer at age 3. She had menarche at age 6. Her first child was born when she was 50. She breast-fed her second child. She is not yet postmenopausal. She denies any other family history of cancer. The patient did undergo a core needle biopsy of this upper inner quadrant 3 mm lesion. The biopsy was positive for lobular carcinoma in situ and atypical lobular hyperplasia.    Other Problems Michelle [name], CMA; 03/27/2014 8:49 AM) Anxiety Disorder Back Pain Depression Oophorectomy Right. Other disease, cancer, significant illness Thyroid Disease  Past Surgical History Michelle [name], CMA; 03/27/2014 8:49 AM) Breast Biopsy Right. Hysterectomy (not due to cancer) - Partial Knee Surgery Right. Spinal Surgery - Neck  Diagnostic Studies History Michelle [name], CMA; 03/27/2014 8:49 AM) Colonoscopy never Mammogram within last year Pap Smear 1-5 years ago  Allergies Michelle [name], CMA; 03/27/2014 8:51 AM) Penicillin G Procaine *PENICILLINS*  Medication History Michelle [name], CMA; 03/27/2014 8:52 AM) BuPROPion HCl ER (XL) (300MG  Tablet ER 24HR, Oral) Active. Levothyroxine Sodium (125MCG Tablet, Oral) Active. Synthroid  (125MCG Tablet, Oral) Active. Wellbutrin XL (300MG  Tablet ER 24HR, Oral) Active. Multivitamins (Oral) Active.  Social History Michelle [name], [place]; 03/27/2014 8:49 AM) Alcohol use Occasional alcohol use. Caffeine use Coffee. No drug use Tobacco use Never smoker.  Family History Michelle [name], [place]; 03/27/2014 8:49 AM) Arthritis Mother. Breast Cancer Sister. Cerebrovascular Accident Father. Depression Mother. Diabetes Mellitus Father. Heart Disease Father. Hypertension Father, Mother. Respiratory Condition Father. Thyroid problems Mother.  Pregnancy / Birth History Michelle [name], CMA; 03/27/2014 8:49 AM) Age at menarche 3 years. Gravida 2 Maternal age 3-25 Para 2  Review of Systems Michelle [name] CMA; 03/27/2014 8:49 AM) General Present- Weight Gain. Not Present- Appetite Loss, Chills, Fatigue, Fever, Night Sweats and Weight Loss. Skin Present- Dryness. Not Present- Change in Wart/Mole, Hives, Jaundice, New Lesions, Non-Healing Wounds, Rash and Ulcer. HEENT Present- Wears glasses/contact lenses. Not Present- Earache, Hearing Loss, Hoarseness, Nose Bleed, Oral Ulcers, Ringing in the Ears, Seasonal Allergies, Sinus Pain, Sore Throat, Visual Disturbances and Yellow Eyes. Breast Present- Breast Pain. Not Present- Breast Mass, Nipple Discharge and Skin Changes. Cardiovascular Present- Leg Cramps. Not Present- Chest Pain, Difficulty Breathing Lying Down, Palpitations, Rapid Heart Rate, Shortness of Breath and Swelling of Extremities. Gastrointestinal Present- Constipation. Not Present- Abdominal Pain, Bloating, Bloody Stool, Change in Bowel Habits, Chronic diarrhea, Difficulty Swallowing, Excessive gas, Gets full quickly at meals, Hemorrhoids, Indigestion, Nausea, Rectal Pain and Vomiting. Musculoskeletal Present- Back Pain, Joint Pain, Joint Stiffness and Swelling of Extremities. Not Present- Muscle Pain and Muscle Weakness. Neurological Present- Headaches and Tingling. Not  Present- Decreased Memory, Fainting, Numbness, Seizures, Tremor, Trouble walking and Weakness. Psychiatric Present- Anxiety and Depression. Not Present- Bipolar, Change in Sleep Pattern, Fearful and Frequent crying. Endocrine Not Present- Cold Intolerance, Excessive Hunger,  Hair Changes, Heat Intolerance, Hot flashes and New Diabetes. Hematology Not Present- Easy Bruising, Excessive bleeding, Gland problems, HIV and Persistent Infections.   Vitals Michelle [name] CMA; 03/27/2014 8:53 AM) 03/27/2014 8:52 AM Weight: 204.2 lb Height: 65in Body Surface Area: 2.06 m Body Mass Index: 33.98 kg/m Temp.: 96.86F  Pulse: 80 (Regular)  Resp.: 16 (Unlabored)  BP: 130/78 (Sitting, Left Arm, Standard)    Physical Exam Michelle Klein MD; 03/27/2014 9:42 AM) General Mental Status-Alert. General Appearance-Consistent with stated age. Hydration-Well hydrated. Voice-Normal.  Head and Neck Head-normocephalic, atraumatic with no lesions or palpable masses. Trachea-midline. Thyroid Gland Characteristics - normal size and consistency.  Eye Eyeball - Bilateral-Extraocular movements intact. Sclera/Conjunctiva - Bilateral-No scleral icterus.  Chest and Lung Exam Chest and lung exam reveals -quiet, even and easy respiratory effort with no use of accessory muscles and on auscultation, normal breath sounds, no adventitious sounds and normal vocal resonance. Inspection Chest Wall - Normal. Back - normal.  Breast Note: Patient has no palpable masses in either breast. There is no axillary lymphadenopathy. Breasts are symmetric bilaterally. The breasts have grade 1 ptosis. There is tenderness at the site of biopsy on the right upper inner quadrant. Breasts are relatively dense bilaterally.   Cardiovascular Cardiovascular examination reveals -normal heart sounds, regular rate and rhythm with no murmurs and normal pedal pulses bilaterally.  Abdomen Inspection Inspection of the  abdomen reveals - No Hernias. Palpation/Percussion Palpation and Percussion of the abdomen reveal - Soft, Non Tender, No Rebound tenderness, No Rigidity (guarding) and No hepatosplenomegaly. Auscultation Auscultation of the abdomen reveals - Bowel sounds normal.  Neurologic Neurologic evaluation reveals -alert and oriented x 3 with no impairment of recent or remote memory. Mental Status-Normal.  Musculoskeletal Global Assessment -Note: no gross deformities.  Normal Exam - Left-Upper Extremity Strength Normal and Lower Extremity Strength Normal. Normal Exam - Right-Upper Extremity Strength Normal and Lower Extremity Strength Normal.  Lymphatic Head & Neck  General Head & Neck Lymphatics: Bilateral - Description - Normal. Axillary  General Axillary Region: Bilateral - Description - Normal. Tenderness - Non Tender. Femoral & Inguinal  Generalized Femoral & Inguinal Lymphatics: Bilateral - Description - No Generalized lymphadenopathy.    Assessment & Plan Michelle Klein MD; 03/27/2014 9:44 AM) ABNORMAL MAMMOGRAM WITH MICROCALCIFICATION (793.81  R92.0) Impression: The patient will need a excisional biopsy of the right abnormal lesion. I reviewed the rationale for this. I discussed that we would need to have a seed placed in order to perform the surgery.  She will also need to be referred to oncology no matter what the result is since she has atypical lobular hyperplasia and a sister with breast cancer. If she does indeed have cancer, she will need to see genetics.  I discussed the risk of surgery including bleeding, infection, pain, possible need for additional surgery or procedures, and wound issues. She would like to have the surgery toward the middle or end of the week in order to minimize her time off work. She works in the front office of a dental clinic.  Of note, the pathology report does incorrectly state that this is a left core needle biopsy. All of her images, her  bruise, and biopsy site or on the right. We have asked the breast center and the pathology department to correct this oversight. Current Plans  Schedule for Surgery Pt Education - [facility] Breast Biopsy Instructions (FLB): discussed with patient and provided information.   Signed by Michelle Klein, MD (03/27/2014 9:45 AM)

## 2014-04-05 ENCOUNTER — Encounter (HOSPITAL_BASED_OUTPATIENT_CLINIC_OR_DEPARTMENT_OTHER): Payer: Self-pay | Admitting: General Surgery

## 2014-04-05 NOTE — Progress Notes (Signed)
Quick Note:  Please let patient know that pathology is negative for cancer. ______

## 2014-04-06 ENCOUNTER — Telehealth: Payer: Self-pay | Admitting: *Deleted

## 2014-04-06 NOTE — Telephone Encounter (Signed)
Received referral from Dr. Barry Dienes for pt to be seen for high risk. Scheduled and confirmed appt with Dr. Lindi Adie on 04/12/14 at 3:45PM. Gave pt contact information. Encourage to call with questions or concerns. Received verbal understanding.

## 2014-04-09 LAB — POCT HEMOGLOBIN-HEMACUE: Hemoglobin: 14.5 g/dL (ref 12.0–15.0)

## 2014-04-12 ENCOUNTER — Ambulatory Visit: Payer: BLUE CROSS/BLUE SHIELD

## 2014-04-12 ENCOUNTER — Telehealth: Payer: Self-pay | Admitting: Hematology and Oncology

## 2014-04-12 ENCOUNTER — Ambulatory Visit (HOSPITAL_BASED_OUTPATIENT_CLINIC_OR_DEPARTMENT_OTHER): Payer: BLUE CROSS/BLUE SHIELD | Admitting: Hematology and Oncology

## 2014-04-12 VITALS — BP 141/70 | HR 86 | Temp 99.6°F | Resp 18 | Ht 64.0 in | Wt 205.5 lb

## 2014-04-12 DIAGNOSIS — D0501 Lobular carcinoma in situ of right breast: Secondary | ICD-10-CM

## 2014-04-12 DIAGNOSIS — N6091 Unspecified benign mammary dysplasia of right breast: Secondary | ICD-10-CM

## 2014-04-12 MED ORDER — TAMOXIFEN CITRATE 20 MG PO TABS: 20.0000 mg | ORAL_TABLET | Freq: Every day | ORAL | Status: DC

## 2014-04-12 NOTE — Assessment & Plan Note (Signed)
LCIS/atypical lobular hyperplasia status post lumpectomy 04/04/2014  Pathology counseling: I discussed with her the difference between LCIS and atypical lobular hyperplasia. I described them as risk factors for breast cancer. I also discussed with her that there are not precancerous lesions.  Risk assessment: Based on rest cancer risk assessment tool from [place], the  lifetime risk of breast cancer with ALH is 35% (average is 12.5%); with LCIS, the risk of recurrence and that 1% per year. This makes it at 40% risk of recurrence.  Risk reduction strategies: I recommended tamoxifen 20 mg daily for 5 years. This would decrease the risk of breast cancer by 50%.  Tamoxifen counseling:We discussed the risks and benefits of tamoxifen. These include but not limited to insomnia, hot flashes, mood changes, vaginal dryness, and weight gain. Although rare, serious side effects including risk of blood clots were also discussed. We strongly believe that the benefits far outweigh the risks. Patient understands these risks and consented to starting treatment. Planned treatment duration is 5 years.  Other risk reduction strategies: 1. Physical exercise especially yoga 2. Vitamin D supplementation 3. The role of turmeric as an antioxidant 4. Eating more fruits and vegetables and less red meat All of these were discussed in great detail as well. Return to clinic in 3 months for follow-up

## 2014-04-12 NOTE — Telephone Encounter (Signed)
per pof to sch pt appt-gave pt copy of sch °

## 2014-04-12 NOTE — Progress Notes (Signed)
Michelle [name] NOTE  Patient Care Team: Raeanne Gathers, MD as PCP - General (Family Medicine)  CHIEF COMPLAINTS/PURPOSE OF CONSULTATION:  Newly diagnosed atypical lobular hyperplasia/LCIS  HISTORY OF PRESENTING ILLNESS:  Michelle [name] 49 y.o. female is here because of recent diagnosis of right breast atypical lobular hyperplasia/LCIS. Patient had a routine screening mammogram that revealed slight abnormalities which were evaluated by ultrasound diagnostic mammogram and the biopsy which revealed that she had lobular carcinoma in situ. She has saw Dr. Barry Dienes who performed a lumpectomy on 04/12/2014 and it came back as atypical lobular hyperplasia along with a fibroadenoma. She was sent to [place] to discuss risk reduction strategies. She reports that she is feeling better from the surgery and is less sore. Patient has chronic complaints of musculoskeletal aches and pains in her fingers and her neck. This she attributes it to degenerate arthritis.  I reviewed her records extensively and collaborated the history with the patient.  SUMMARY OF ONCOLOGIC HISTORY:   Neoplasm of right breast, primary tumor staging category Tis: lobular carcinoma in situ (LCIS)   03/13/2014 Initial Diagnosis Lobular carcinoma in situ   04/04/2014 Surgery Right breast lumpectomy: Atypical lobular hyperplasia, fibroadenoma 1.1 cm   04/12/2014 -  Anti-estrogen oral therapy Tamoxifen 20 mg daily 5 years for risk reduction    In terms of breast cancer risk profile:  She menarched at early age of 40 and had hysterectomy and one ovary removed  She had 2 pregnancy, her first child was born at age 68  She has received birth control pills for approximately 9 years.  She was never exposed to fertility medications or hormone replacement therapy.  She has  family history of Breast/GYN/GI cancer Sr. was diagnosed with breast cancer age 29  MEDICAL HISTORY:  Past Medical History  Diagnosis Date  . Hypothyroidism    . Depression   . Wears glasses   . Anxiety   . DDD (degenerative disc disease), cervical     SURGICAL HISTORY: Past Surgical History  Procedure Laterality Date  . Cervical fusion  2005  . Dilation and curettage of uterus    . Tubal ligation    . Total vaginal hysterectomy  2008  . Knee arthroscopy  1979,2000    right  . Carpal tunnel release  1993    right  . Tonsillectomy    . Radioactive seed guided excisional breast biopsy Right 04/04/2014    Procedure: RADIOACTIVE SEED GUIDED EXCISIONAL RIGHT BREAST BIOPSY;  Surgeon: [name], MD;  Location: [facility];  Service: General;  Laterality: Right;    SOCIAL HISTORY: History   Social History  . Marital Status: Married    Spouse Name: N/A  . Number of Children: N/A  . Years of Education: N/A   Occupational History  . Not on file.   Social History Main Topics  . Smoking status: Never Smoker   . Smokeless tobacco: Not on file  . Alcohol Use: Yes     Comment: occ  . Drug Use: No  . Sexual Activity: Not on file   Other Topics Concern  . Not on file   Social History Narrative    FAMILY HISTORY: No family history on file.  ALLERGIES:  is allergic to sertraline and penicillins.  MEDICATIONS:  Current Outpatient Prescriptions  Medication Sig Dispense Refill  . buPROPion (WELLBUTRIN XL) 300 MG 24 hr tablet Take 300 mg by mouth daily.    . cyclobenzaprine (FLEXERIL) 10 MG tablet Take 10  mg by mouth 3 (three) times daily as needed for muscle spasms.    Marland Kitchen ibuprofen (ADVIL,MOTRIN) 200 MG tablet Take 400 mg by mouth.    . levothyroxine (SYNTHROID, LEVOTHROID) 125 MCG tablet Take 125 mcg by mouth daily before breakfast.    . Multiple Vitamin (MULTIVITAMIN) capsule Take 1 capsule by mouth.    . Multiple Vitamins-Minerals (MULTIVITAMIN WITH MINERALS) tablet Take 1 tablet by mouth daily.    Marland Kitchen oxyCODONE-acetaminophen (ROXICET) 5-325 MG per tablet Take 1-2 tablets by mouth every 4 (four) hours as needed for  severe pain. 30 tablet 0  . tamoxifen (NOLVADEX) 20 MG tablet Take 1 tablet (20 mg total) by mouth daily. 90 tablet 3  . UNABLE TO FIND [place] Wart 1 tab qd.    Marland Kitchen UNABLE TO FIND Take by mouth. Zinc    . vitamin B-12 (CYANOCOBALAMIN) 1000 MCG tablet Take 1,000 mcg by mouth.    . vitamin C (ASCORBIC ACID) 500 MG tablet Take 500 mg by mouth daily.     No current facility-administered medications for this visit.    REVIEW OF SYSTEMS:   Constitutional: Denies fevers, chills or abnormal night sweats Eyes: Denies blurriness of vision, double vision or watery eyes Ears, nose, mouth, throat, and face: Denies mucositis or sore throat Respiratory: Denies cough, dyspnea or wheezes Cardiovascular: Denies palpitation, chest discomfort or lower extremity swelling Gastrointestinal:  Denies nausea, heartburn or change in bowel habits Skin: Denies abnormal skin rashes Lymphatics: Denies new lymphadenopathy or easy bruising Neurological:Denies numbness, tingling or new weaknesses Behavioral/Psych: Mood is stable, no new changes  Breast:  Recovering well from surgery All other systems were reviewed with the patient and are negative.  PHYSICAL EXAMINATION: ECOG PERFORMANCE STATUS: 1 - Symptomatic but completely ambulatory  Filed Vitals:   04/12/14 1538  BP: 141/70  Pulse: 86  Temp: 99.6 F (37.6 C)  Resp: 18   Filed Weights   04/12/14 1538  Weight: 205 lb 8 oz (93.214 kg)    GENERAL:alert, no distress and comfortable SKIN: skin color, texture, turgor are normal, no rashes or significant lesions EYES: normal, conjunctiva are pink and non-injected, sclera clear OROPHARYNX:no exudate, no erythema and lips, buccal mucosa, and tongue normal  NECK: supple, thyroid normal size, non-tender, without nodularity LYMPH:  no palpable lymphadenopathy in the cervical, axillary or inguinal LUNGS: clear to auscultation and percussion with normal breathing effort HEART: regular rate & rhythm and no  murmurs and no lower extremity edema ABDOMEN:abdomen soft, non-tender and normal bowel sounds Musculoskeletal:no cyanosis of digits and no clubbing  PSYCH: alert & oriented x 3 with fluent speech NEURO: no focal motor/sensory deficits  LABORATORY DATA:  I have reviewed the data as listed Lab Results  Component Value Date   WBC 7.3 10/29/2008   HGB 14.5 04/04/2014   HCT 38.0 10/29/2008   MCV 94.4 10/29/2008   PLT 184 10/29/2008   Lab Results  Component Value Date   NA 137 10/29/2008   K 3.8 SLIGHT HEMOLYSIS 10/29/2008   CL 105 10/29/2008   CO2 25 10/29/2008   ASSESSMENT AND PLAN:  Neoplasm of right breast, primary tumor staging category Tis: lobular carcinoma in situ (LCIS) LCIS/atypical lobular hyperplasia status post lumpectomy 04/04/2014  Pathology counseling: I discussed with her the difference between LCIS and atypical lobular hyperplasia. I described them as risk factors for breast cancer. I also discussed with her that there are not precancerous lesions.  Risk assessment: Based on rest cancer risk assessment tool from [employer], the  lifetime risk of breast cancer with ALH is 35% (average is 12.5%); with LCIS, the risk of recurrence and that 1% per year. This makes it at 40% risk of recurrence.  Risk reduction strategies: I recommended tamoxifen 20 mg daily for 5 years. This would decrease the risk of breast cancer by 50%.  Tamoxifen counseling:We discussed the risks and benefits of tamoxifen. These include but not limited to insomnia, hot flashes, mood changes, vaginal dryness, and weight gain. Although rare, serious side effects including risk of blood clots were also discussed. We strongly believe that the benefits far outweigh the risks. Patient understands these risks and consented to starting treatment. Planned treatment duration is 5 years.  Other risk reduction strategies: 1. Physical exercise especially yoga 2. Vitamin D supplementation 3. The role  of turmeric as an antioxidant 4. Eating more fruits and vegetables and less red meat All of these were discussed in great detail as well. Return to clinic in 3 months for follow-up  All questions were answered. The patient knows to call the clinic with any problems, questions or concerns.    Rulon Eisenmenger, MD 4:30 PM

## 2014-04-13 ENCOUNTER — Encounter: Payer: Self-pay | Admitting: Hematology and Oncology

## 2014-04-17 ENCOUNTER — Telehealth: Payer: Self-pay | Admitting: Genetic Counselor

## 2014-04-17 NOTE — Telephone Encounter (Signed)
Patient called asking questions about genetic counseling and testing.  Discussed that a family and personal history of cancer will be taken at her appointment.  If she meets criteria for testing we will order it, and the lab will preauthorize her sample with the insurance company.  IF she would owe more than $100 out of pocket the lab will call and allow her to cancel the test.

## 2014-05-03 ENCOUNTER — Encounter: Payer: Self-pay | Admitting: Genetic Counselor

## 2014-05-03 ENCOUNTER — Other Ambulatory Visit: Payer: BLUE CROSS/BLUE SHIELD

## 2014-05-03 ENCOUNTER — Ambulatory Visit (HOSPITAL_BASED_OUTPATIENT_CLINIC_OR_DEPARTMENT_OTHER): Payer: BLUE CROSS/BLUE SHIELD | Admitting: Genetic Counselor

## 2014-05-03 DIAGNOSIS — Z803 Family history of malignant neoplasm of breast: Secondary | ICD-10-CM | POA: Diagnosis not present

## 2014-05-03 DIAGNOSIS — N62 Hypertrophy of breast: Secondary | ICD-10-CM | POA: Diagnosis not present

## 2014-05-03 DIAGNOSIS — N6091 Unspecified benign mammary dysplasia of right breast: Secondary | ICD-10-CM

## 2014-05-03 DIAGNOSIS — D0501 Lobular carcinoma in situ of right breast: Secondary | ICD-10-CM

## 2014-05-03 DIAGNOSIS — Z315 Encounter for genetic counseling: Secondary | ICD-10-CM

## 2014-05-03 NOTE — Progress Notes (Signed)
Patient Name: Michelle [name] Patient Age: 49 y.o. Encounter Date: 05/03/2014  Referring Physician: Nicholas Lose, MD  Primary Care Provider: Raeanne Gathers, MD   [name], a 49 y.o. female, is being seen at the [facility] due to a family history of breast cancer cancer. She presents to clinic today to discuss the possibility of a hereditary predisposition to cancer and discuss whether genetic testing is warranted.  HISTORY OF PRESENT ILLNESS: Michelle [name] was diagnosed was recently found to have right breast atypical lobular hyperplasia and LCIS. She is s/p lumpectomy and is taking Tamoxifen. She had a hysterectomy (one ovary intact) in 2008 due to fibroids. She is undergoing a yearly mammogram, clinical breast exam and gynecologic exam.    Past Medical History  Diagnosis Date  . Hypothyroidism   . Depression   . Wears glasses   . Anxiety   . DDD (degenerative disc disease), cervical   . Atypical lobular hyperplasia of right breast   . Family history of breast cancer     Past Surgical History  Procedure Laterality Date  . Cervical fusion  2005  . Dilation and curettage of uterus    . Tubal ligation    . Total vaginal hysterectomy  2008  . Knee arthroscopy  1979,2000    right  . Carpal tunnel release  1993    right  . Tonsillectomy    . Radioactive seed guided excisional breast biopsy Right 04/04/2014    Procedure: RADIOACTIVE SEED GUIDED EXCISIONAL RIGHT BREAST BIOPSY;  Surgeon: [name], MD;  Location: [facility];  Service: General;  Laterality: Right;  . Back surgery      Fusion C3-C6    History   Social History  . Marital Status: Married    Spouse Name: N/A  . Number of Children: N/A  . Years of Education: N/A   Social History Main Topics  . Smoking status: Never Smoker   . Smokeless tobacco: Never Used  . Alcohol Use: 1.8 oz/week    3 Glasses of wine per week     Comment: occ  . Drug Use: No  . Sexual Activity:  Yes    Birth Control/ Protection: Surgical   Other Topics Concern  . Not on file   Social History Narrative     FAMILY HISTORY:   During the visit, a 4-generation pedigree was obtained. Family tree will be sent for scanning and will be in EPIC under the Media tab.  Significant diagnoses include the following:  Family History  Problem Relation Age of Onset  . Breast cancer Sister 30    bilateral ; currently 90    Additionally, Michelle [name] has a son and daughter. Other than her sister who had breast cancer, she has two other sisters (ages 82 and 55) and a brother (age 5) who are all cancer-free. Her mother (age 35) had a TAH/BSO (age unknown). Her mother had no siblings. Her father (age 47) is cancer-free as are his two sisters (ages late 80s). There are no cancers in any grandparents.  Michelle [name] ancestry is [place]. There is no known Jewish ancestry and no consanguinity.  ASSESSMENT AND PLAN: Michelle [name] is a 49 y.o. female with a personal history of [place] and LCIS and family history of bilateral breast cancer in her sister at a young age. This history is not highly suggestive of a hereditary predisposition to cancer, but genetic testing is indicated due to her small family history. Further, her  mother had a TAH/BSO which impacts her cancer risk. We reviewed the characteristics, features and inheritance patterns of hereditary cancer syndromes. We also discussed genetic testing, including the appropriate family members to test such as her sister, the process of testing, insurance coverage and implications of results. A negative result will be reassuring, but we discussed that it is recommended that her sister get tested as well.  Michelle [name] wished to pursue genetic testing and a blood sample will be sent to [facility] for analysis of the 17 genes on the BreastNext gene panel (ATM, BARD1, BRCA1, BRCA2, BRIP1, CDH1, CHEK2, MRE11A, MUTYH, NBN, NF1, PALB2, PTEN, RAD50, RAD51C, RAD51D, and  TP53). We discussed the implications of a positive, negative and/ or Variant of Uncertain Significance (VUS) result. Results should be available in approximately 3-4 weeks, at which point we will contact her and address implications for her as well as address genetic testing for at-risk family members, if needed.    We encouraged Michelle [name] to remain in contact with Cancer Genetics annually so that we can update the family history and inform her of any changes in cancer genetics and testing that may be of benefit for this family. Ms.  [name] questions were answered to her satisfaction today.   Thank you for the referral and allowing [place] to share in the care of your patient.   The patient was seen for a total of 30 minutes, greater than 50% of which was spent face-to-face counseling. This patient was discussed with the overseeing provider who agrees with the above.   Steele Berg, MS, [place] Certified Genetic Counselor phone: [phone] [name]

## 2014-05-16 ENCOUNTER — Encounter: Payer: Self-pay | Admitting: Genetic Counselor

## 2014-05-16 DIAGNOSIS — Z1379 Encounter for other screening for genetic and chromosomal anomalies: Secondary | ICD-10-CM | POA: Insufficient documentation

## 2014-05-16 NOTE — Progress Notes (Signed)
GENETIC TEST RESULTS  Patient Name: [name] Patient Age: 49 y.o. Encounter Date: 05/16/2014  Referring Physician: Nicholas Lose, MD   Michelle [name] was called today to discuss genetic test results. Please see the Genetics note from her visit on 05/03/14 for a detailed discussion of her personal and family history.  GENETIC TESTING: At the time of Michelle [name]'s visit, we recommended she pursue genetic testing of multiple genes on the BreastNext gene panel. This test, which included sequencing and deletion/duplication analysis of 17 genes, was performed at [facility]. Testing was normal and did not reveal a mutation in these genes. The genes tested were ATM, BARD1, BRCA1, BRCA2, BRIP1, CDH1, CHEK2, MRE11A, MUTYH, NBN, NF1, PALB2, PTEN, RAD50, RAD51C, RAD51D, and TP53.  We discussed with Michelle [name] that since the current test is not perfect, it is possible there may be a gene mutation that current testing cannot detect, but that chance is small. We also discussed that it is possible that a different genetic factor, which was not part of this testing or has not yet been discovered, is responsible for the cancer diagnoses in the family. Finally, it may be that there is a mutation in [name]' family that she did not inherit. Should Michelle [name] wish to discuss or pursue this additional testing, we are happy to coordinate this at any time, but do not feel that she is at significant risk of harboring a mutation in a different gene.     CANCER SCREENING: This normal result is reassuring and indicates that Michelle [name] does not likely have an increased risk of cancer due to a mutation in one of these genes. She is aware her risk is elevated due to her history of LCIS and ALH. We recommended Michelle [name] continue to follow the cancer screening guidelines provided by her primary physician.   FAMILY MEMBERS: While these results are reassuring for Michelle [name], this test does not tell us anything about  Michelle [name]' sisters' genetic status. We recommend that her sister who had bilateral breast cancer at a young age also have genetic counseling and testing. Please let us know if we can help facilitate testing. Genetic counselors can be located in other cities, by visiting the website of the [employer] ([employer]ist[employer]se) and Field seismologist for a Dietitian by zip code.    Lastly, we discussed with Michelle [name] that cancer genetics is a rapidly advancing field and it is possible that new genetic tests will be appropriate for her in the future. We encouraged her to remain in contact with [place] on an annual basis so we can update her personal and family histories, and let her know of advances in cancer genetics that may benefit the family. Our contact number was provided. Michelle [name] questions were answered to her satisfaction today, and she knows she is welcome to call anytime with additional questions.    Michelle Berg, MS, [place] Certified Genetic Counselor phone: [phone] [name]

## 2014-07-13 ENCOUNTER — Ambulatory Visit (HOSPITAL_BASED_OUTPATIENT_CLINIC_OR_DEPARTMENT_OTHER): Payer: BLUE CROSS/BLUE SHIELD | Admitting: Hematology and Oncology

## 2014-07-13 ENCOUNTER — Telehealth: Payer: Self-pay | Admitting: Hematology and Oncology

## 2014-07-13 VITALS — BP 123/74 | HR 69 | Temp 98.2°F | Resp 18 | Ht 64.0 in | Wt 204.5 lb

## 2014-07-13 DIAGNOSIS — Z853 Personal history of malignant neoplasm of breast: Secondary | ICD-10-CM

## 2014-07-13 DIAGNOSIS — Z79811 Long term (current) use of aromatase inhibitors: Secondary | ICD-10-CM

## 2014-07-13 DIAGNOSIS — D0501 Lobular carcinoma in situ of right breast: Secondary | ICD-10-CM

## 2014-07-13 NOTE — Telephone Encounter (Signed)
Appointments made and avs printed for patient °

## 2014-07-13 NOTE — Assessment & Plan Note (Signed)
LCIS/atypical lobular hyperplasia status post lumpectomy 04/04/2014 Risk assessment: Based on rest cancer risk assessment tool from [place], the lifetime risk of breast cancer with ALH is 35% (average is 12.5%); with LCIS, the risk of recurrence and that 1% per year. This makes it at 40% risk of recurrence. Risk reduction strategies: tamoxifen 20 mg daily for 5 years. This would decrease the risk of breast cancer by 50%.  Tamoxifen toxicities:  Return to clinic in 6 months for follow-up

## 2014-07-13 NOTE — Progress Notes (Signed)
Patient Care Team: Raeanne Gathers, MD as PCP - General (Family Medicine)  DIAGNOSIS: No matching staging information was found for the patient.  SUMMARY OF ONCOLOGIC HISTORY:   Neoplasm of right breast, primary tumor staging category Tis: lobular carcinoma in situ (LCIS)   03/13/2014 Initial Diagnosis Lobular carcinoma in situ   04/04/2014 Surgery Right breast lumpectomy: Atypical lobular hyperplasia, fibroadenoma 1.1 cm   04/12/2014 -  Anti-estrogen oral therapy Tamoxifen 20 mg daily 5 years for risk reduction    CHIEF COMPLIANT: Follow-up on tamoxifen therapy  INTERVAL HISTORY: Michelle [name] is a 6 year with above-mentioned history of LCIS currently on tamoxifen for risk reduction. She is tolerating it extremely well. She complains of muscle aches and pains which she had prior to all of this being started. She has not been taking ibuprofen. She thought there was a contraindication for ibuprofen on tamoxifen. She denies any hot flashes.  REVIEW OF SYSTEMS:   Constitutional: Denies fevers, chills or abnormal weight loss Eyes: Denies blurriness of vision Ears, nose, mouth, throat, and face: Denies mucositis or sore throat Respiratory: Denies cough, dyspnea or wheezes Cardiovascular: Denies palpitation, chest discomfort or lower extremity swelling Gastrointestinal:  Denies nausea, heartburn or change in bowel habits Skin: Denies abnormal skin rashes Lymphatics: Denies new lymphadenopathy or easy bruising Neurological:Denies numbness, tingling or new weaknesses Behavioral/Psych: Mood is stable, no new changes  Breast:  denies any pain or lumps or nodules in either breasts All other systems were reviewed with the patient and are negative.  I have reviewed the past medical history, past surgical history, social history and family history with the patient and they are unchanged from previous note.  ALLERGIES:  is allergic to sertraline and penicillins.  MEDICATIONS:  Current Outpatient  Prescriptions  Medication Sig Dispense Refill  . buPROPion (WELLBUTRIN XL) 300 MG 24 hr tablet Take 300 mg by mouth daily.    . cyclobenzaprine (FLEXERIL) 10 MG tablet Take 10 mg by mouth 3 (three) times daily as needed for muscle spasms.    Marland Kitchen ibuprofen (ADVIL,MOTRIN) 200 MG tablet Take 400 mg by mouth.    . levothyroxine (SYNTHROID, LEVOTHROID) 125 MCG tablet Take 125 mcg by mouth daily before breakfast.    . Multiple Vitamin (MULTIVITAMIN) capsule Take 1 capsule by mouth.    . Multiple Vitamins-Minerals (MULTIVITAMIN WITH MINERALS) tablet Take 1 tablet by mouth daily.    Marland Kitchen oxyCODONE-acetaminophen (ROXICET) 5-325 MG per tablet Take 1-2 tablets by mouth every 4 (four) hours as needed for severe pain. 30 tablet 0  . tamoxifen (NOLVADEX) 20 MG tablet Take 1 tablet (20 mg total) by mouth daily. 90 tablet 3  . UNABLE TO FIND [facility]' Wart 1 tab qd.    Marland Kitchen UNABLE TO FIND Take by mouth. Zinc    . vitamin B-12 (CYANOCOBALAMIN) 1000 MCG tablet Take 1,000 mcg by mouth.    . vitamin C (ASCORBIC ACID) 500 MG tablet Take 500 mg by mouth daily.     No current facility-administered medications for this visit.    PHYSICAL EXAMINATION: ECOG PERFORMANCE STATUS: 1 - Symptomatic but completely ambulatory  Filed Vitals:   07/13/14 1224  BP: 123/74  Pulse: 69  Temp: 98.2 F (36.8 C)  Resp: 18   Filed Weights   07/13/14 1224  Weight: 204 lb 8 oz (92.761 kg)    GENERAL:alert, no distress and comfortable SKIN: skin color, texture, turgor are normal, no rashes or significant lesions EYES: normal, Conjunctiva are pink and non-injected, sclera clear  OROPHARYNX:no exudate, no erythema and lips, buccal mucosa, and tongue normal  NECK: supple, thyroid normal size, non-tender, without nodularity LYMPH:  no palpable lymphadenopathy in the cervical, axillary or inguinal LUNGS: clear to auscultation and percussion with normal breathing effort HEART: regular rate & rhythm and no murmurs and no lower extremity  edema ABDOMEN:abdomen soft, non-tender and normal bowel sounds Musculoskeletal:no cyanosis of digits and no clubbing  NEURO: alert & oriented x 3 with fluent speech, no focal motor/sensory deficits LABORATORY DATA:  I have reviewed the data as listed   Chemistry      Component Value Date/Time   NA 137 10/29/2008 1848   K 3.8 SLIGHT HEMOLYSIS 10/29/2008 1848   CL 105 10/29/2008 1848   CO2 25 10/29/2008 1848   BUN 13 10/29/2008 1848   CREATININE .7 10/29/2008 1848      Component Value Date/Time   CALCIUM 9.3 10/29/2008 1848   ALKPHOS 83 08/06/2006 1409   AST 26 08/06/2006 1409   ALT 21 08/06/2006 1409   BILITOT 0.9 08/06/2006 1409       Lab Results  Component Value Date   WBC 7.3 10/29/2008   HGB 14.5 04/04/2014   HCT 38.0 10/29/2008   MCV 94.4 10/29/2008   PLT 184 10/29/2008   NEUTROABS 4.1 10/29/2008   ASSESSMENT & PLAN:  Neoplasm of right breast, primary tumor staging category Tis: lobular carcinoma in situ (LCIS) LCIS/atypical lobular hyperplasia status post lumpectomy 04/04/2014 Risk assessment: Based on rest cancer risk assessment tool from [place], the lifetime risk of breast cancer with ALH is 35% (average is 12.5%); with LCIS, the risk of recurrence and that 1% per year. This makes it at 40% risk of recurrence. Risk reduction strategies: tamoxifen 20 mg daily for 5 years. This would decrease the risk of breast cancer by 50%.  Tamoxifen toxicities: 1. Muscle aches and pains for which she takes ibuprofen 2. Denies any hot flashes   Return to clinic in 6 months for follow-up    No orders of the defined types were placed in this encounter.   The patient has a good understanding of the overall plan. she agrees with it. she will call with any problems that may develop before the next visit here.   Rulon Eisenmenger, MD

## 2014-08-10 ENCOUNTER — Other Ambulatory Visit: Payer: Self-pay | Admitting: Hematology and Oncology

## 2014-08-10 DIAGNOSIS — Z803 Family history of malignant neoplasm of breast: Secondary | ICD-10-CM

## 2014-08-10 DIAGNOSIS — D0501 Lobular carcinoma in situ of right breast: Secondary | ICD-10-CM

## 2014-08-10 DIAGNOSIS — N6091 Unspecified benign mammary dysplasia of right breast: Secondary | ICD-10-CM

## 2014-09-07 ENCOUNTER — Telehealth: Payer: Self-pay | Admitting: Genetic Counselor

## 2014-09-07 NOTE — Telephone Encounter (Signed)
Discussed billing for [facility].  She will need to send check from [facility] to [place].  Cephus Shelling will eventually bill her for the amount owed, or she can send up front. Wannetta Sender billing phone number.

## 2014-11-14 ENCOUNTER — Telehealth: Payer: Self-pay

## 2014-11-15 NOTE — Telephone Encounter (Signed)
Returned pt call.  Pt is experiencing pain in her right breast.  Achy, tender, intermittent "surge".  Pt reports having had these symptoms for a few months after symptoms but they resolve.  They are now recurring.  Pt denies any temps, redness, swelling, heat or discharge.    Appt made for pt on 10/11.  Pt voiced understanding.

## 2014-11-19 NOTE — Assessment & Plan Note (Signed)
LCIS/atypical lobular hyperplasia status post lumpectomy 04/04/2014 Risk assessment: Based on rest cancer risk assessment tool from [place], the lifetime risk of breast cancer with ALH is 35% (average is 12.5%); with LCIS, the risk of recurrence and that 1% per year. This makes it at 40% risk of recurrence. Risk reduction strategies: tamoxifen 20 mg daily for 5 years. This would decrease the risk of breast cancer by 50%.  Tamoxifen toxicities: 1. Muscle aches and pains for which she takes ibuprofen 2. Denies any hot flashes   Breast Cancer Surveillance: 1. Breast exam 11/20/14: Normal 2. Mammogram: To be done Jan 2017  Return to clinic in 6 months for follow-up

## 2014-11-20 ENCOUNTER — Encounter: Payer: Self-pay | Admitting: Hematology and Oncology

## 2014-11-20 ENCOUNTER — Ambulatory Visit (HOSPITAL_BASED_OUTPATIENT_CLINIC_OR_DEPARTMENT_OTHER): Payer: BLUE CROSS/BLUE SHIELD | Admitting: Hematology and Oncology

## 2014-11-20 VITALS — BP 140/69 | HR 78 | Temp 98.7°F | Resp 20 | Ht 64.0 in | Wt 194.9 lb

## 2014-11-20 DIAGNOSIS — Z79810 Long term (current) use of selective estrogen receptor modulators (SERMs): Secondary | ICD-10-CM

## 2014-11-20 DIAGNOSIS — D0501 Lobular carcinoma in situ of right breast: Secondary | ICD-10-CM | POA: Diagnosis not present

## 2014-11-20 NOTE — Progress Notes (Signed)
Patient Care Team: Raeanne Gathers, MD as PCP - General (Family Medicine)  DIAGNOSIS: No matching staging information was found for the patient.  SUMMARY OF ONCOLOGIC HISTORY:   Neoplasm of right breast, primary tumor staging category Tis: lobular carcinoma in situ (LCIS)   03/13/2014 Initial Diagnosis Lobular carcinoma in situ   04/04/2014 Surgery Right breast lumpectomy: Atypical lobular hyperplasia, fibroadenoma 1.1 cm   04/12/2014 -  Anti-estrogen oral therapy Tamoxifen 20 mg daily 5 years for risk reduction    CHIEF COMPLIANT: Follow-up on tamoxifen  INTERVAL HISTORY: Michelle [name] is a 49 year old with above-mentioned history of LCIS right breast treated with lumpectomy and is now on risk reduction therapy with tamoxifen. She appears to be tolerating it moderately well. She does complain of hot flashes occasionally along with musculoskeletal aches and pains occasionally. Things have improved significantly from before. She does not have as much symptoms as before. She is complaining of occasional breast discomfort especially in the side when she had surgery.  REVIEW OF SYSTEMS:   Constitutional: Denies fevers, chills or abnormal weight loss Eyes: Denies blurriness of vision Ears, nose, mouth, throat, and face: Denies mucositis or sore throat Respiratory: Denies cough, dyspnea or wheezes Cardiovascular: Denies palpitation, chest discomfort or lower extremity swelling Gastrointestinal:  Denies nausea, heartburn or change in bowel habits Skin: Denies abnormal skin rashes Lymphatics: Denies new lymphadenopathy or easy bruising Neurological:Denies numbness, tingling or new weaknesses Behavioral/Psych: Mood is stable, no new changes  Breast: Intermittent right breast pain All other systems were reviewed with the patient and are negative.  I have reviewed the past medical history, past surgical history, social history and family history with the patient and they are unchanged from previous  note.  ALLERGIES:  is allergic to sertraline and penicillins.  MEDICATIONS:  Current Outpatient Prescriptions  Medication Sig Dispense Refill  . buPROPion (WELLBUTRIN XL) 300 MG 24 hr tablet Take 300 mg by mouth daily.    . cyclobenzaprine (FLEXERIL) 10 MG tablet Take 10 mg by mouth 3 (three) times daily as needed for muscle spasms.    Marland Kitchen ibuprofen (ADVIL,MOTRIN) 200 MG tablet Take 400 mg by mouth.    . levothyroxine (SYNTHROID, LEVOTHROID) 125 MCG tablet Take 125 mcg by mouth daily before breakfast.    . Multiple Vitamin (MULTIVITAMIN) capsule Take 1 capsule by mouth.    . Multiple Vitamins-Minerals (MULTIVITAMIN WITH MINERALS) tablet Take 1 tablet by mouth daily.    Marland Kitchen oxyCODONE-acetaminophen (ROXICET) 5-325 MG per tablet Take 1-2 tablets by mouth every 4 (four) hours as needed for severe pain. 30 tablet 0  . tamoxifen (NOLVADEX) 20 MG tablet TAKE 1 TABLET (20 MG TOTAL) BY MOUTH DAILY. 90 tablet 1  . UNABLE TO FIND [facility]' Wart 1 tab qd.    Marland Kitchen UNABLE TO FIND Take by mouth. Zinc    . vitamin B-12 (CYANOCOBALAMIN) 1000 MCG tablet Take 1,000 mcg by mouth.    . vitamin C (ASCORBIC ACID) 500 MG tablet Take 500 mg by mouth daily.     No current facility-administered medications for this visit.    PHYSICAL EXAMINATION: ECOG PERFORMANCE STATUS: 1 - Symptomatic but completely ambulatory  Filed Vitals:   11/20/14 1542  BP: 140/69  Pulse: 78  Temp: 98.7 F (37.1 C)  Resp: 20   Filed Weights   11/20/14 1542  Weight: 194 lb 14.4 oz (88.406 kg)    GENERAL:alert, no distress and comfortable SKIN: skin color, texture, turgor are normal, no rashes or significant lesions EYES: normal,  Conjunctiva are pink and non-injected, sclera clear OROPHARYNX:no exudate, no erythema and lips, buccal mucosa, and tongue normal  NECK: supple, thyroid normal size, non-tender, without nodularity LYMPH:  no palpable lymphadenopathy in the cervical, axillary or inguinal LUNGS: clear to auscultation and  percussion with normal breathing effort HEART: regular rate & rhythm and no murmurs and no lower extremity edema ABDOMEN:abdomen soft, non-tender and normal bowel sounds Musculoskeletal:no cyanosis of digits and no clubbing  NEURO: alert & oriented x 3 with fluent speech, no focal motor/sensory deficits BREAST: No palpable masses or nodules in either right or left breasts. No palpable axillary supraclavicular or infraclavicular adenopathy no breast tenderness or nipple discharge. (exam performed in the presence of a chaperone)  LABORATORY DATA:  I have reviewed the data as listed   Chemistry      Component Value Date/Time   NA 137 10/29/2008 1848   K 3.8 SLIGHT HEMOLYSIS 10/29/2008 1848   CL 105 10/29/2008 1848   CO2 25 10/29/2008 1848   BUN 13 10/29/2008 1848   CREATININE .7 10/29/2008 1848      Component Value Date/Time   CALCIUM 9.3 10/29/2008 1848   ALKPHOS 83 08/06/2006 1409   AST 26 08/06/2006 1409   ALT 21 08/06/2006 1409   BILITOT 0.9 08/06/2006 1409       Lab Results  Component Value Date   WBC 7.3 10/29/2008   HGB 14.5 04/04/2014   HCT 38.0 10/29/2008   MCV 94.4 10/29/2008   PLT 184 10/29/2008   NEUTROABS 4.1 10/29/2008   ASSESSMENT & PLAN:  Neoplasm of right breast, primary tumor staging category Tis: lobular carcinoma in situ (LCIS) LCIS/atypical lobular hyperplasia status post lumpectomy 04/04/2014 Risk assessment: Based on rest cancer risk assessment tool from [place], the lifetime risk of breast cancer with ALH is 35% (average is 12.5%); with LCIS, the risk of recurrence and that 1% per year. This makes it at 40% risk of recurrence. Risk reduction strategies: tamoxifen 20 mg daily for 5 years. This would decrease the risk of breast cancer by 50%. Patient's sister was diagnosed with stage IV breast cancer and she is extremely sad about it.  Tamoxifen toxicities: 1. Muscle aches and pains for which she takes ibuprofen intermittently 2.  Occasional hot flashes  Breast Cancer Surveillance: 1. Breast exam 11/20/14: Normal 2. Mammogram: To be done Jan 2017  Return to clinic in 6 months for follow-up   No orders of the defined types were placed in this encounter.   The patient has a good understanding of the overall plan. she agrees with it. she will call with any problems that may develop before the next visit here.   Rulon Eisenmenger, MD

## 2014-11-20 NOTE — Addendum Note (Signed)
Addended by: Prentiss Bells on: 11/20/2014 05:16 PM   Modules accepted: Medications

## 2014-11-21 ENCOUNTER — Telehealth: Payer: Self-pay | Admitting: Hematology and Oncology

## 2014-11-21 NOTE — Telephone Encounter (Signed)
lvm for pt regarding to [place] and April appt.....advised pt to call back if she did not want to keep the DEC appt...per pof pt needed to be seen in 2mths.

## 2015-01-10 ENCOUNTER — Telehealth: Payer: Self-pay | Admitting: Hematology and Oncology

## 2015-01-10 NOTE — Telephone Encounter (Signed)
Patient called in and  Cancelled her appointment as it is not needed and she will keep her April appointment

## 2015-01-11 ENCOUNTER — Ambulatory Visit: Payer: BLUE CROSS/BLUE SHIELD | Admitting: Hematology and Oncology

## 2015-01-11 ENCOUNTER — Telehealth: Payer: Self-pay | Admitting: Hematology and Oncology

## 2015-01-11 NOTE — Telephone Encounter (Signed)
Appointments changed per patient request to a friday

## 2015-02-10 ENCOUNTER — Other Ambulatory Visit: Payer: Self-pay | Admitting: Hematology and Oncology

## 2015-02-13 NOTE — Telephone Encounter (Signed)
Chart reviewed.

## 2015-04-03 ENCOUNTER — Other Ambulatory Visit: Payer: Self-pay | Admitting: Obstetrics and Gynecology

## 2015-04-03 DIAGNOSIS — Z9189 Other specified personal risk factors, not elsewhere classified: Secondary | ICD-10-CM

## 2015-04-13 ENCOUNTER — Other Ambulatory Visit: Payer: BLUE CROSS/BLUE SHIELD

## 2015-05-17 ENCOUNTER — Ambulatory Visit (HOSPITAL_BASED_OUTPATIENT_CLINIC_OR_DEPARTMENT_OTHER): Payer: BLUE CROSS/BLUE SHIELD | Admitting: Hematology and Oncology

## 2015-05-17 ENCOUNTER — Encounter: Payer: Self-pay | Admitting: Hematology and Oncology

## 2015-05-17 ENCOUNTER — Telehealth: Payer: Self-pay | Admitting: Hematology and Oncology

## 2015-05-17 VITALS — BP 138/70 | HR 79 | Temp 97.9°F | Resp 18 | Ht 64.0 in | Wt 205.5 lb

## 2015-05-17 DIAGNOSIS — Z86000 Personal history of in-situ neoplasm of breast: Secondary | ICD-10-CM | POA: Diagnosis not present

## 2015-05-17 DIAGNOSIS — Z79810 Long term (current) use of selective estrogen receptor modulators (SERMs): Secondary | ICD-10-CM | POA: Diagnosis not present

## 2015-05-17 DIAGNOSIS — D0501 Lobular carcinoma in situ of right breast: Secondary | ICD-10-CM

## 2015-05-17 MED ORDER — PRIMIDONE 250 MG PO TABS: 250.0000 mg | ORAL_TABLET | Freq: Every day | ORAL | Status: DC

## 2015-05-17 NOTE — Assessment & Plan Note (Signed)
LCIS/atypical lobular hyperplasia status post lumpectomy 04/04/2014 Risk assessment: Based on rest cancer risk assessment tool from [place], the lifetime risk of breast cancer with ALH is 35% (average is 12.5%); with LCIS, the risk of recurrence and that 1% per year. This makes it at 40% risk of recurrence. Risk reduction strategies: tamoxifen 20 mg daily for 5 years. This would decrease the risk of breast cancer by 50%. Patient's sister was diagnosed with stage IV breast cancer and she is extremely sad about it.  Tamoxifen toxicities: 1. Muscle aches and pains for which she takes ibuprofen intermittently 2. Occasional hot flashes  Breast Cancer Surveillance: 1. Breast exam 11/20/14: Normal 2. Mammogram: To be done Jan 2017  Return to clinic in 6 months for follow-up

## 2015-05-17 NOTE — Telephone Encounter (Signed)
appt made and avs printed °

## 2015-05-17 NOTE — Progress Notes (Signed)
Patient Care Team: Raeanne Gathers, MD as PCP - General (Family Medicine)  SUMMARY OF ONCOLOGIC HISTORY:   Neoplasm of right breast, primary tumor staging category Tis: lobular carcinoma in situ (LCIS)   03/13/2014 Initial Diagnosis Lobular carcinoma in situ   04/04/2014 Surgery Right breast lumpectomy: Atypical lobular hyperplasia, fibroadenoma 1.1 cm   04/12/2014 -  Anti-estrogen oral therapy Tamoxifen 20 mg daily 5 years for risk reduction    CHIEF COMPLIANT: Follow-up on tamoxifen therapy  INTERVAL HISTORY: Michelle [name] is a 50 year old with above-mentioned history of LCIS currently on tamoxifen therapy. She appears to be tolerating tamoxifen fairly well. But she has had a few health problems including severe muscle cramps that were significantly affecting her lifestyle. She was put on a new medication which has been helping her. Her blood pressure is also improved by drinking coconut water and taking potassium supplementation.  REVIEW OF SYSTEMS:   Constitutional: Denies fevers, chills or abnormal weight loss Eyes: Denies blurriness of vision Ears, nose, mouth, throat, and face: Denies mucositis or sore throat Respiratory: Denies cough, dyspnea or wheezes Cardiovascular: Denies palpitation, chest discomfort Gastrointestinal:  Denies nausea, heartburn or change in bowel habits Skin: Denies abnormal skin rashes Lymphatics: Denies new lymphadenopathy or easy bruising Neurological:Denies numbness, tingling or new weaknesses Behavioral/Psych: Mood is stable, no new changes  Extremities: No lower extremity edema Breast:  denies any pain or lumps or nodules in either breasts All other systems were reviewed with the patient and are negative.  I have reviewed the past medical history, past surgical history, social history and family history with the patient and they are unchanged from previous note.  ALLERGIES:  is allergic to sertraline and penicillins.  MEDICATIONS:  Current  Outpatient Prescriptions  Medication Sig Dispense Refill  . buPROPion (WELLBUTRIN XL) 300 MG 24 hr tablet Take 300 mg by mouth daily.    . cyclobenzaprine (FLEXERIL) 10 MG tablet Take 10 mg by mouth 3 (three) times daily as needed for muscle spasms.    Marland Kitchen ibuprofen (ADVIL,MOTRIN) 200 MG tablet Take 400 mg by mouth.    . levothyroxine (SYNTHROID, LEVOTHROID) 125 MCG tablet Take 125 mcg by mouth daily before breakfast.    . Multiple Vitamin (MULTIVITAMIN) capsule Take 1 capsule by mouth.    . Multiple Vitamins-Minerals (MULTIVITAMIN WITH MINERALS) tablet Take 1 tablet by mouth daily.    Marland Kitchen oxyCODONE-acetaminophen (ROXICET) 5-325 MG per tablet Take 1-2 tablets by mouth every 4 (four) hours as needed for severe pain. 30 tablet 0  . primidone (MYSOLINE) 250 MG tablet Take 1 tablet (250 mg total) by mouth at bedtime.    . tamoxifen (NOLVADEX) 20 MG tablet TAKE 1 TABLET (20 MG TOTAL) BY MOUTH DAILY. 90 tablet 1  . UNABLE TO FIND [facility] Wart 1 tab qd.    Marland Kitchen UNABLE TO FIND Take by mouth. Zinc    . vitamin B-12 (CYANOCOBALAMIN) 1000 MCG tablet Take 1,000 mcg by mouth.    . vitamin C (ASCORBIC ACID) 500 MG tablet Take 500 mg by mouth daily.     No current facility-administered medications for this visit.    PHYSICAL EXAMINATION: ECOG PERFORMANCE STATUS: 1 - Symptomatic but completely ambulatory  Filed Vitals:   05/17/15 1154  BP: 138/70  Pulse: 79  Temp: 97.9 F (36.6 C)  Resp: 18   Filed Weights   05/17/15 1154  Weight: 205 lb 8 oz (93.214 kg)    GENERAL:alert, no distress and comfortable SKIN: skin color, texture, turgor are  normal, no rashes or significant lesions EYES: normal, Conjunctiva are pink and non-injected, sclera clear OROPHARYNX:no exudate, no erythema and lips, buccal mucosa, and tongue normal  NECK: supple, thyroid normal size, non-tender, without nodularity LYMPH:  no palpable lymphadenopathy in the cervical, axillary or inguinal LUNGS: clear to auscultation and  percussion with normal breathing effort HEART: regular rate & rhythm and no murmurs and no lower extremity edema ABDOMEN:abdomen soft, non-tender and normal bowel sounds MUSCULOSKELETAL:no cyanosis of digits and no clubbing  NEURO: alert & oriented x 3 with fluent speech, no focal motor/sensory deficits EXTREMITIES: No lower extremity edema  LABORATORY DATA:  I have reviewed the data as listed   Chemistry      Component Value Date/Time   NA 137 10/29/2008 1848   K 3.8 SLIGHT HEMOLYSIS 10/29/2008 1848   CL 105 10/29/2008 1848   CO2 25 10/29/2008 1848   BUN 13 10/29/2008 1848   CREATININE .7 10/29/2008 1848      Component Value Date/Time   CALCIUM 9.3 10/29/2008 1848   ALKPHOS 83 08/06/2006 1409   AST 26 08/06/2006 1409   ALT 21 08/06/2006 1409   BILITOT 0.9 08/06/2006 1409       Lab Results  Component Value Date   WBC 7.3 10/29/2008   HGB 14.5 04/04/2014   HCT 38.0 10/29/2008   MCV 94.4 10/29/2008   PLT 184 10/29/2008   NEUTROABS 4.1 10/29/2008   ASSESSMENT & PLAN:  Neoplasm of right breast, primary tumor staging category Tis: lobular carcinoma in situ (LCIS) LCIS/atypical lobular hyperplasia status post lumpectomy 04/04/2014 Risk assessment: Based on rest cancer risk assessment tool from [place], the lifetime risk of breast cancer with ALH is 35% (average is 12.5%); with LCIS, the risk of recurrence and that 1% per year. This makes it at 40% risk of recurrence. Risk reduction strategies: tamoxifen 20 mg daily for 5 years. This would decrease the risk of breast cancer by 50%. Patient's sister was diagnosed with stage IV breast cancer and she is extremely sad about it.  Tamoxifen toxicities: 1. Muscle aches and pains for which she takes ibuprofen intermittently 2. Occasional hot flashes 3. Multiple other health issues including fluctuation of blood pressure, hypokalemia, muscle cramps  Breast Cancer Surveillance: 1. Breast exam 11/20/14: Normal 2.  [facility]: [facility].  Return to clinic in 6 months for follow-up   No orders of the defined types were placed in this encounter.   The patient has a good understanding of the overall plan. she agrees with it. she will call with any problems that may develop before the next visit here.   Rulon Eisenmenger, MD 05/17/2015

## 2015-05-28 ENCOUNTER — Ambulatory Visit: Payer: BLUE CROSS/BLUE SHIELD | Admitting: Hematology and Oncology

## 2015-07-30 IMAGING — MG MM RT RADIOACTIVE SEED LOC MAMMO GUIDE
7 series · 7 of 7 positions shown · non-contrast
Comparison: Previous exam(s).

CLINICAL DATA: Biopsy 12 o'clock right breast revealed LCIS

EXAM:
MAMMOGRAPHIC GUIDED RADIOACTIVE SEED LOCALIZATION OF THE RIGHT
BREAST

[R CC (1 of 4)]
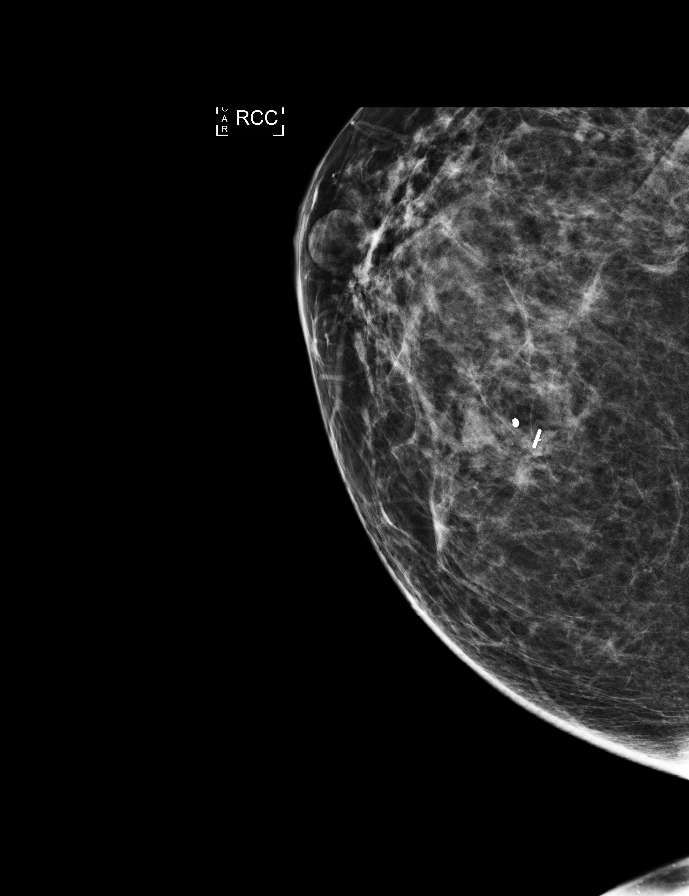

[R ML (1 of 3)]
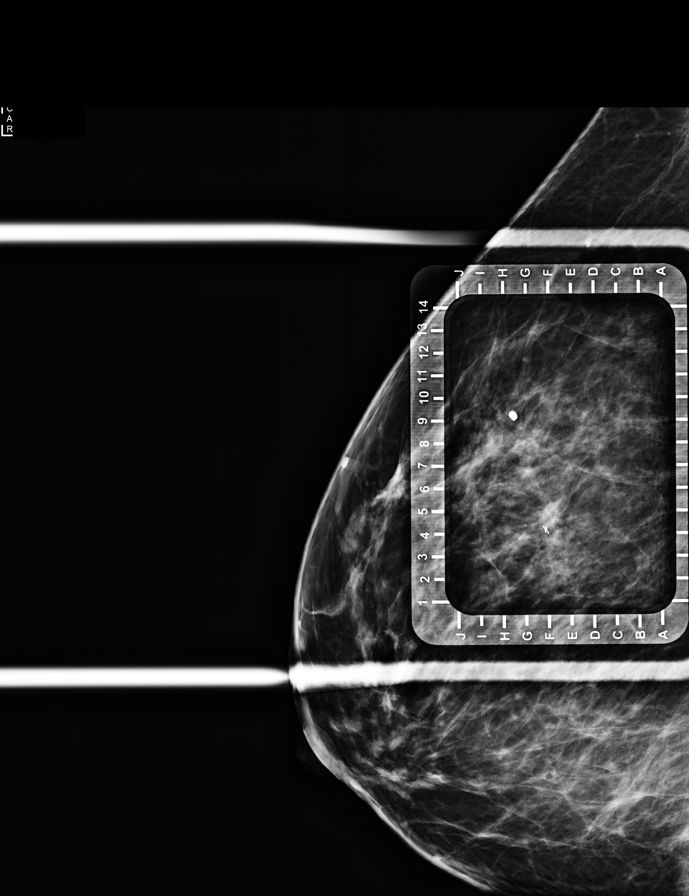

[R CC (2 of 4)]
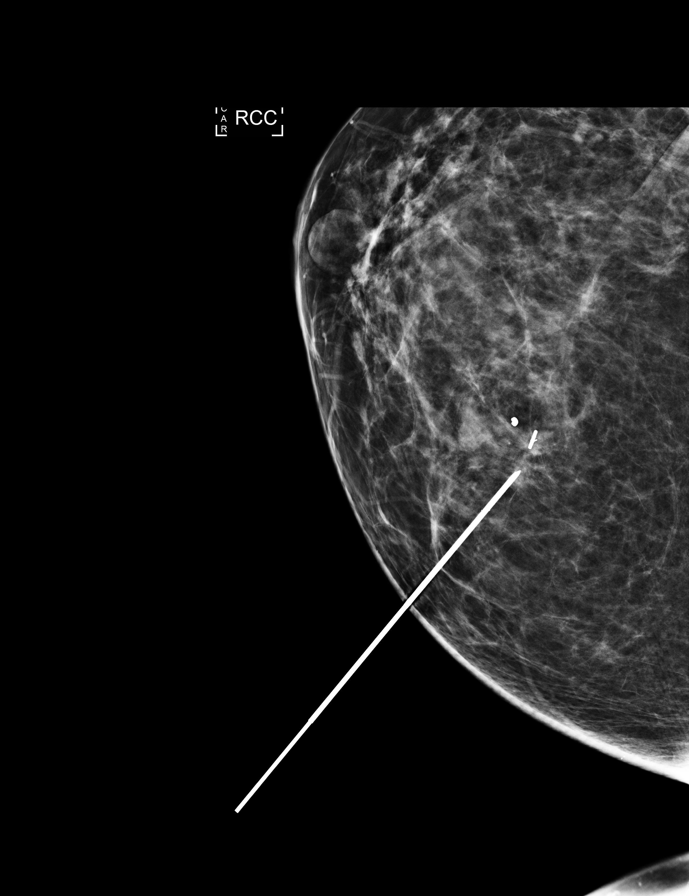

[R ML (2 of 3)]
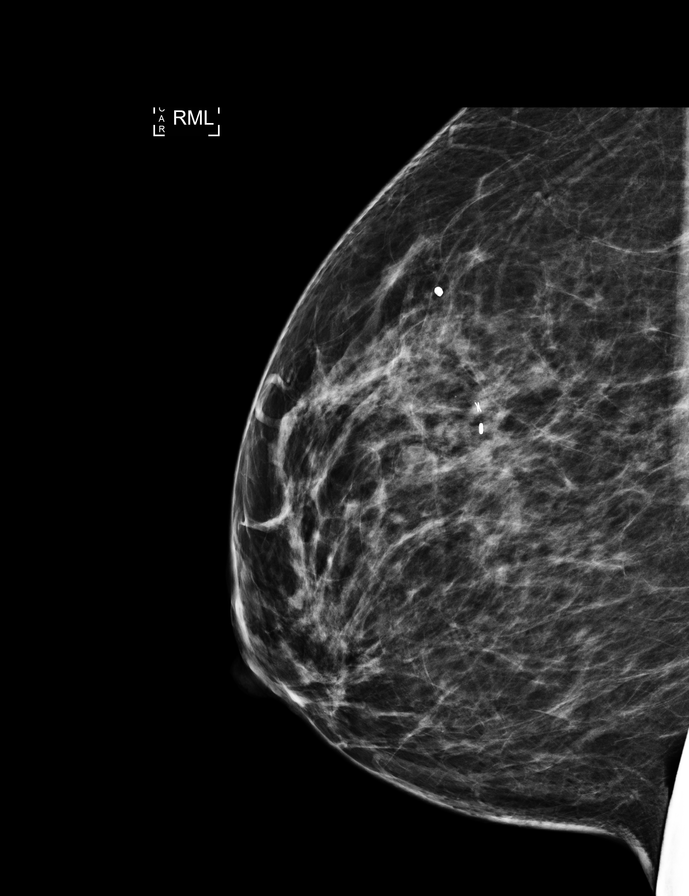

[R CC (3 of 4)]
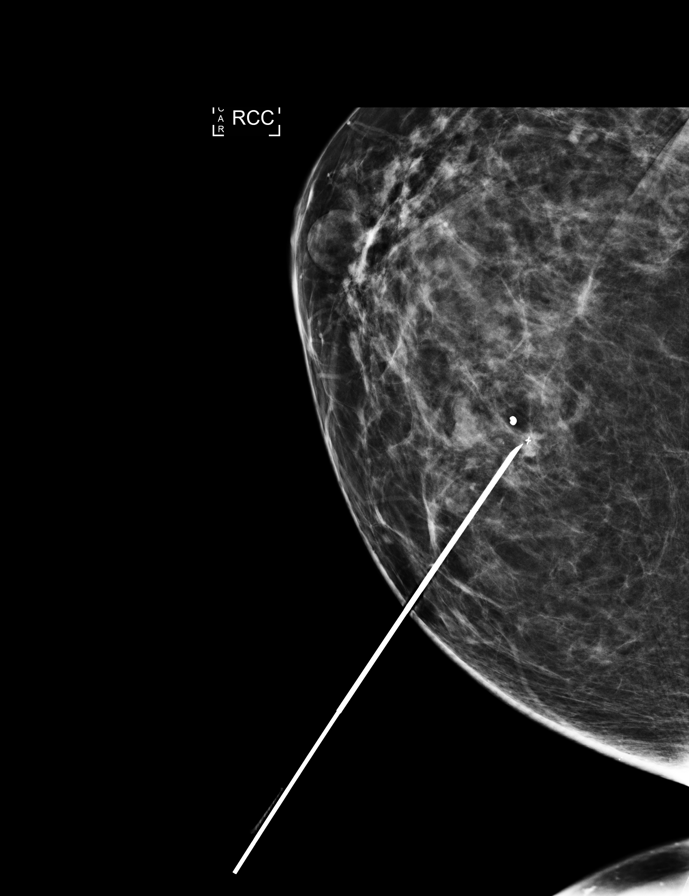

[R ML (3 of 3)]
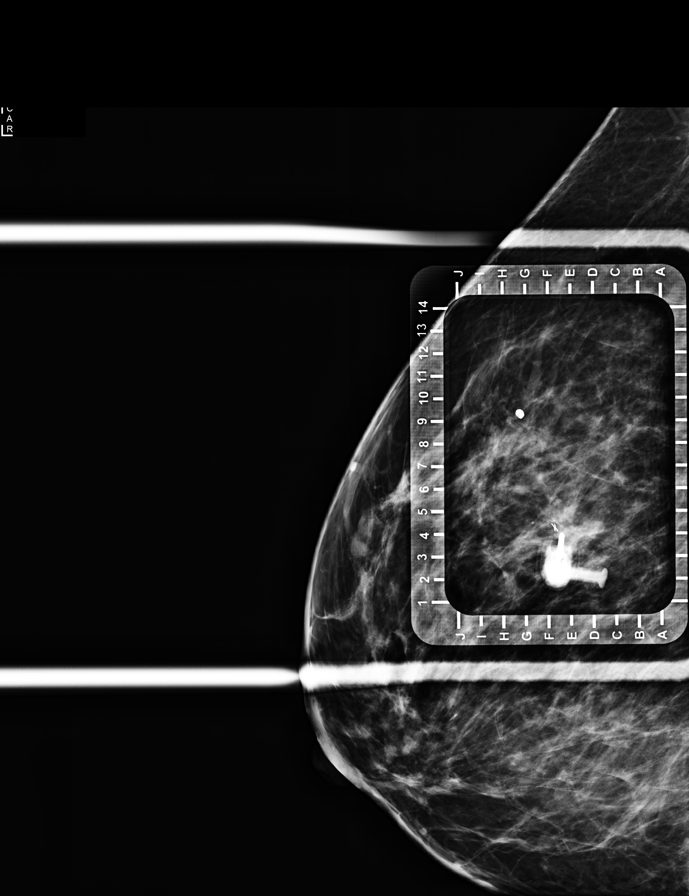

[R CC (4 of 4)]
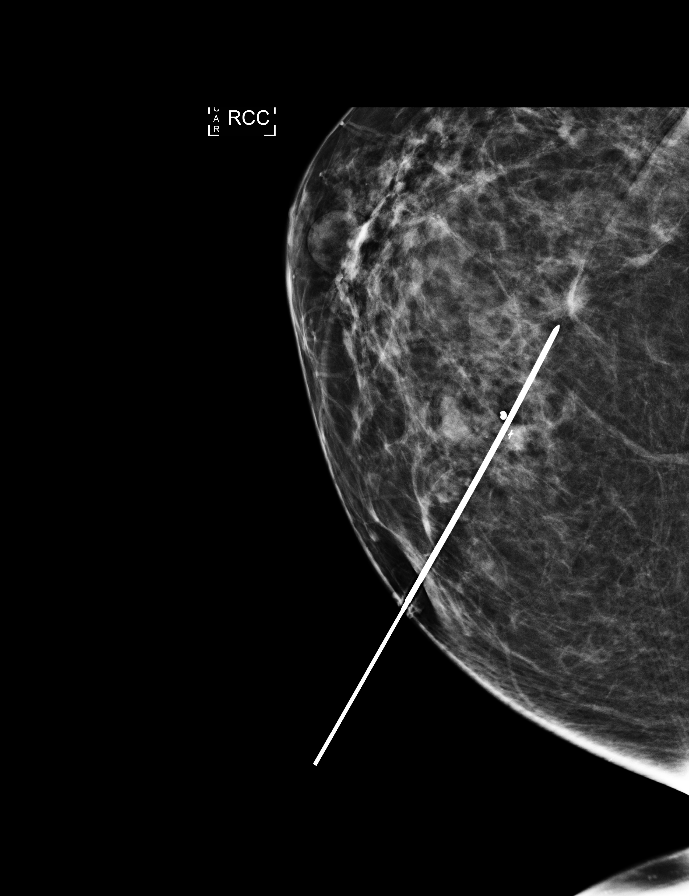

[7 of 7 positions shown; findings below may reference images not displayed]

FINDINGS: Patient presents for radioactive seed localization prior to
excisional biopsy. I met with the patient and we discussed the
procedure of seed localization including benefits and alternatives.
We discussed the high likelihood of a successful procedure. We
discussed the risks of the procedure including infection, bleeding,
tissue injury and further surgery. We discussed the low dose of
radioactivity involved in the procedure. Informed, written consent
was given.

The usual time-out protocol was performed immediately prior to the
procedure.

Using mammographic guidance, sterile technique, 2% lidocaine and an
[id number] radioactive seed, the marker clip was localized using a
lateral medial approach. The follow-up mammogram images confirm the
seed in the expected location and are marked for Dr. Letti.

Follow-up survey of the patient confirms presence of the radioactive
seed.

Order number of R-P88 seed:  [id number].

Total activity:  0.250 mci  reference Date: 03/20/14

The patient tolerated the procedure well and was released from the
[REDACTED]. She was given instructions regarding seed removal.
IMPRESSION: Radioactive seed localization right breast. No apparent
complications.

## 2015-08-20 ENCOUNTER — Other Ambulatory Visit: Payer: Self-pay | Admitting: Hematology and Oncology

## 2015-11-29 ENCOUNTER — Ambulatory Visit (HOSPITAL_BASED_OUTPATIENT_CLINIC_OR_DEPARTMENT_OTHER): Payer: BLUE CROSS/BLUE SHIELD | Admitting: Hematology and Oncology

## 2015-11-29 DIAGNOSIS — E876 Hypokalemia: Secondary | ICD-10-CM | POA: Diagnosis not present

## 2015-11-29 DIAGNOSIS — D0501 Lobular carcinoma in situ of right breast: Secondary | ICD-10-CM

## 2015-11-29 NOTE — Assessment & Plan Note (Signed)
LCIS/atypical lobular hyperplasia status post lumpectomy 04/04/2014 Risk assessment: Based on rest cancer risk assessment tool from [place], the lifetime risk of breast cancer with ALH is 35% (average is 12.5%); with LCIS, the risk of recurrence and that 1% per year. This makes it at 40% risk of recurrence. Risk reduction strategies: tamoxifen 20 mg daily for 5 years. This would decrease the risk of breast cancer by 50%. Patient's sister was diagnosed with stage IV breast cancer and she is extremely sad about it.  Tamoxifen toxicities: 1. Muscle aches and pains for which she takes ibuprofen intermittently 2. Occasional hot flashes 3. Multiple other health issues including fluctuation of blood pressure, hypokalemia, muscle cramps  Breast Cancer Surveillance: 1. Breast exam 11/29/15: Normal 2. [facility]  Return to clinic in 6 months for follow-up

## 2015-11-29 NOTE — Progress Notes (Signed)
Patient Care Team: Raeanne Gathers, MD as PCP - General (Family Medicine)  DIAGNOSIS:  Encounter Diagnosis  Name Primary?  . Neoplasm of right breast, primary tumor staging category Tis: lobular carcinoma in situ (LCIS)     SUMMARY OF ONCOLOGIC HISTORY:   Neoplasm of right breast, primary tumor staging category Tis: lobular carcinoma in situ (LCIS)   03/13/2014 Initial Diagnosis    Lobular carcinoma in situ      04/04/2014 Surgery    Right breast lumpectomy: Atypical lobular hyperplasia, fibroadenoma 1.1 cm      04/12/2014 -  Anti-estrogen oral therapy    Tamoxifen 20 mg daily 5 years for risk reduction       CHIEF COMPLIANT: Complaining of lots of cramps in the legs  INTERVAL HISTORY: Michelle [name] is a 50 year old with above-mentioned history of LCIS currently on this reduction therapy with tamoxifen. She's been on for year and half and complains of cramps at nighttime. This has not been getting any better in spite of 5 benign mass and fluids and electrolyte replacement therapies that she had done on her own. She also feels stiffness in the joints and muscles throughout the day especially when she sits for any long time. She has discomfort in the right axilla where she had surgery.  REVIEW OF SYSTEMS:   Constitutional: Denies fevers, chills or abnormal weight loss Eyes: Denies blurriness of vision Ears, nose, mouth, throat, and face: Denies mucositis or sore throat Respiratory: Denies cough, dyspnea or wheezes Cardiovascular: Denies palpitation, chest discomfort Gastrointestinal:  Denies nausea, heartburn or change in bowel habits Skin: Denies abnormal skin rashes Lymphatics: Denies new lymphadenopathy or easy bruising Neurological:Denies numbness, tingling or new weaknesses Behavioral/Psych: Mood is stable, no new changes  Extremities: No lower extremity edema Breast:  Discomfort in the right breast/axilla All other systems were reviewed with the patient and are  negative.  I have reviewed the past medical history, past surgical history, social history and family history with the patient and they are unchanged from previous note.  ALLERGIES:  is allergic to sertraline and penicillins.  MEDICATIONS:  Current Outpatient Prescriptions  Medication Sig Dispense Refill  . buPROPion (WELLBUTRIN XL) 300 MG 24 hr tablet Take 300 mg by mouth daily.    . cyclobenzaprine (FLEXERIL) 10 MG tablet Take 10 mg by mouth 3 (three) times daily as needed for muscle spasms.    Marland Kitchen ibuprofen (ADVIL,MOTRIN) 200 MG tablet Take 400 mg by mouth.    . levothyroxine (SYNTHROID, LEVOTHROID) 125 MCG tablet Take 125 mcg by mouth daily before breakfast.    . Multiple Vitamin (MULTIVITAMIN) capsule Take 1 capsule by mouth.    . Multiple Vitamins-Minerals (MULTIVITAMIN WITH MINERALS) tablet Take 1 tablet by mouth daily.    Marland Kitchen oxyCODONE-acetaminophen (ROXICET) 5-325 MG per tablet Take 1-2 tablets by mouth every 4 (four) hours as needed for severe pain. 30 tablet 0  . primidone (MYSOLINE) 250 MG tablet Take 1 tablet (250 mg total) by mouth at bedtime.    . tamoxifen (NOLVADEX) 20 MG tablet TAKE 1 TABLET (20 MG TOTAL) BY MOUTH DAILY. 90 tablet 1  . UNABLE TO FIND [facility] Wart 1 tab qd.    Marland Kitchen UNABLE TO FIND Take by mouth. Zinc    . vitamin B-12 (CYANOCOBALAMIN) 1000 MCG tablet Take 1,000 mcg by mouth.    . vitamin C (ASCORBIC ACID) 500 MG tablet Take 500 mg by mouth daily.     No current facility-administered medications for this visit.  PHYSICAL EXAMINATION: ECOG PERFORMANCE STATUS: 1 - Symptomatic but completely ambulatory  Vitals:   11/29/15 1112  BP: 129/77  Pulse: 82  Resp: 18  Temp: 98.2 F (36.8 C)   Filed Weights   11/29/15 1112  Weight: 207 lb 6.4 oz (94.1 kg)    GENERAL:alert, no distress and comfortable SKIN: skin color, texture, turgor are normal, no rashes or significant lesions EYES: normal, Conjunctiva are pink and non-injected, sclera  clear OROPHARYNX:no exudate, no erythema and lips, buccal mucosa, and tongue normal  NECK: supple, thyroid normal size, non-tender, without nodularity LYMPH:  no palpable lymphadenopathy in the cervical, axillary or inguinal LUNGS: clear to auscultation and percussion with normal breathing effort HEART: regular rate & rhythm and no murmurs and no lower extremity edema ABDOMEN:abdomen soft, non-tender and normal bowel sounds MUSCULOSKELETAL:no cyanosis of digits and no clubbing  NEURO: alert & oriented x 3 with fluent speech, no focal motor/sensory deficits EXTREMITIES: No lower extremity edema  LABORATORY DATA:  I have reviewed the data as listed   Chemistry      Component Value Date/Time   NA 137 10/29/2008 1848   K 3.8 SLIGHT HEMOLYSIS 10/29/2008 1848   CL 105 10/29/2008 1848   CO2 25 10/29/2008 1848   BUN 13 10/29/2008 1848   CREATININE 0.7 10/29/2008 1848      Component Value Date/Time   CALCIUM 9.3 10/29/2008 1848   ALKPHOS 83 08/06/2006 1409   AST 26 08/06/2006 1409   ALT 21 08/06/2006 1409   BILITOT 0.9 08/06/2006 1409       Lab Results  Component Value Date   WBC 7.3 10/29/2008   HGB 14.5 04/04/2014   HCT 38.0 10/29/2008   MCV 94.4 10/29/2008   PLT 184 10/29/2008   NEUTROABS 4.1 10/29/2008     ASSESSMENT & PLAN:  Neoplasm of right breast, primary tumor staging category Tis: lobular carcinoma in situ (LCIS) LCIS/atypical lobular hyperplasia status post lumpectomy 04/04/2014 Risk assessment: Based on rest cancer risk assessment tool from [place], the lifetime risk of breast cancer with ALH is 35% (average is 12.5%); with LCIS, the risk of recurrence and that 1% per year. This makes it at 40% risk of recurrence. Risk reduction strategies: tamoxifen 20 mg daily for 5 years. This would decrease the risk of breast cancer by 50%. Patient's sister was diagnosed with stage IV breast cancer and she is extremely sad about it.  Tamoxifen  toxicities: 1. Muscle aches and pains for which she takes ibuprofen intermittently: I discussed with her to use either collagen or Osteo Bi-Flex on to break or glucosamine to see if her muscle cramps and aches get better. If none of these worked in instructed her to cut down the dosage of tamoxifen to 10 mg daily. 2. Occasional hot flashes 3. Multiple other health issues including fluctuation of blood pressure, hypokalemia, muscle cramps  Breast Cancer Surveillance: 1. Breast exam 11/29/15: Normal 2. [facility]  Return to clinic in 6 months for follow-up after that we can see her once a year.   No orders of the defined types were placed in this encounter.  The patient has a good understanding of the overall plan. she agrees with it. she will call with any problems that may develop before the next visit here.   Rulon Eisenmenger, MD 11/29/15

## 2016-01-11 ENCOUNTER — Other Ambulatory Visit: Payer: Self-pay | Admitting: Hematology and Oncology

## 2016-01-19 ENCOUNTER — Encounter: Payer: Self-pay | Admitting: Emergency Medicine

## 2016-01-19 ENCOUNTER — Emergency Department
Admission: EM | Admit: 2016-01-19 | Discharge: 2016-01-19 | Disposition: A | Discharge status: Home / Self Care | Payer: BLUE CROSS/BLUE SHIELD | Source: Home / Self Care | Attending: Family Medicine | Admitting: Family Medicine

## 2016-01-19 DIAGNOSIS — R21 Rash and other nonspecific skin eruption: Secondary | ICD-10-CM

## 2016-01-19 HISTORY — DX: Cramp and spasm: R25.2

## 2016-01-19 MED ORDER — ERYTHROMYCIN 2 % EX OINT: 1.0000 "application " | TOPICAL_OINTMENT | Freq: Two times a day (BID) | CUTANEOUS | 0 refills | Status: DC

## 2016-01-19 MED ORDER — HYDROCORTISONE 1 % EX CREA: TOPICAL_CREAM | CUTANEOUS | 0 refills | Status: DC

## 2016-01-19 NOTE — ED Triage Notes (Signed)
Patient reports noticing rash on face and upper chest the evening of 01/16/16; now has worsened and is itching and tingling; she has been taking benadryl 50mg  po q 4-6 hours since then.

## 2016-01-19 NOTE — ED Provider Notes (Signed)
CSN: [id number]     Arrival date & time 01/19/16  1410 History   First MD Initiated Contact with Patient 01/19/16 1435     Chief Complaint  Patient presents with  . Rash   (Consider location/radiation/quality/duration/timing/severity/associated sxs/prior Treatment) HPI Michelle [name] is a 50 y.o. female presenting to UC with c/o erythematous rash that is mild to moderately pruritic and tingling on her face and a little on neck for about 3 days.  She has tried Benadryl 50mg  every 4-6 hours with mild temporary relief.  Denies new soaps, lotions, medications, lotions or makeup. No contact with others with similar rash. Denies fever, chills, n/v/d. No rash to arms, legs, back or abdomen.   Past Medical History:  Diagnosis Date  . Anxiety   . Atypical lobular hyperplasia of right breast   . DDD (degenerative disc disease), cervical   . Depression   . Family history of breast cancer   . Hypothyroidism   . Leg cramps   . Wears glasses    Past Surgical History:  Procedure Laterality Date  . BACK SURGERY     Fusion C3-C6  . [place]   right  . CERVICAL FUSION  2005  . DILATION AND CURETTAGE OF UTERUS    . KNEE ARTHROSCOPY  1979,2000   right  . RADIOACTIVE SEED GUIDED EXCISIONAL BREAST BIOPSY Right 04/04/2014   Procedure: RADIOACTIVE SEED GUIDED EXCISIONAL RIGHT BREAST BIOPSY;  Surgeon: [name], MD;  Location: [facility];  Service: General;  Laterality: Right;  . TONSILLECTOMY    . TOTAL VAGINAL HYSTERECTOMY  2008  . TUBAL LIGATION     Family History  Problem Relation Age of Onset  . Breast cancer Sister 73    bilateral ; currently 80   Social History  Substance Use Topics  . Smoking status: Never Smoker  . Smokeless tobacco: Never Used  . Alcohol use 1.8 oz/week    3 Glasses of wine per week     Comment: occ   OB History    No data available     Review of Systems  Constitutional: Negative for chills and fever.  Respiratory:  Negative for cough, chest tightness and wheezing.   Gastrointestinal: Negative for diarrhea, nausea and vomiting.  Musculoskeletal: Negative for arthralgias, joint swelling and myalgias.  Skin: Positive for color change and rash. Negative for wound.    Allergies  Sertraline and Penicillins  Home Medications   Prior to Admission medications   Medication Sig Start Date End Date Taking? Authorizing Provider  buPROPion (WELLBUTRIN XL) 300 MG 24 hr tablet Take 300 mg by mouth daily.    Historical Provider, MD  cyclobenzaprine (FLEXERIL) 10 MG tablet Take 10 mg by mouth 3 (three) times daily as needed for muscle spasms.    Historical Provider, MD  Erythromycin 2 % ointment Apply 1 application topically 2 (two) times daily. For 5 days 01/19/16   Noland Fordyce, PA-C  hydrocortisone cream 1 % Apply to affected area 2 times daily for 5-7 days 01/19/16   Noland Fordyce, PA-C  ibuprofen (ADVIL,MOTRIN) 200 MG tablet Take 400 mg by mouth.    Historical Provider, MD  levothyroxine (SYNTHROID, LEVOTHROID) 125 MCG tablet Take 125 mcg by mouth daily before breakfast.    Historical Provider, MD  Multiple Vitamin (MULTIVITAMIN) capsule Take 1 capsule by mouth.    Historical Provider, MD  Multiple Vitamins-Minerals (MULTIVITAMIN WITH MINERALS) tablet Take 1 tablet by mouth daily.    Historical Provider,  MD  oxyCODONE-acetaminophen (ROXICET) 5-325 MG per tablet Take 1-2 tablets by mouth every 4 (four) hours as needed for severe pain. 04/04/14   [name], MD  primidone (MYSOLINE) 250 MG tablet Take 1 tablet (250 mg total) by mouth at bedtime. 05/17/15   Nicholas Lose, MD  tamoxifen (NOLVADEX) 20 MG tablet TAKE 1 TABLET (20 MG TOTAL) BY MOUTH DAILY. 01/14/16   Nicholas Lose, MD  UNABLE TO FIND [facility]' Wart 1 tab qd.    Historical Provider, MD  UNABLE TO FIND Take by mouth. Zinc    Historical Provider, MD  vitamin B-12 (CYANOCOBALAMIN) 1000 MCG tablet Take 1,000 mcg by mouth.    Historical Provider, MD  vitamin C  (ASCORBIC ACID) 500 MG tablet Take 500 mg by mouth daily.    Historical Provider, MD   Meds Ordered and Administered this Visit  Medications - No data to display  BP 147/77 (BP Location: Left Arm)   Pulse 84   Temp 97.9 F (36.6 C) (Oral)   Resp 16   Ht 5\' 4"  (1.626 m)   Wt 200 lb (90.7 kg)   SpO2 100%   BMI 34.33 kg/m  No data found.   Physical Exam  Constitutional: She is oriented to person, place, and time. She appears well-developed and well-nourished. No distress.  HENT:  Head: Normocephalic and atraumatic.  Eyes: EOM are normal.  Neck: Normal range of motion.  Cardiovascular: Normal rate.   Pulmonary/Chest: Effort normal. No respiratory distress.  Musculoskeletal: Normal range of motion.  Neurological: She is alert and oriented to person, place, and time.  Skin: Skin is warm and dry. Rash noted. She is not diaphoretic. There is erythema.  Face: erythematous rash with telangiectasias on face including both cheeks, chin, and scant amount of erythema into upper portion of neck. Non-tender. No fluctuance or induration. No bleeding or discharge.   Psychiatric: She has a normal mood and affect. Her behavior is normal.  Nursing note and vitals reviewed.   Urgent Care Course   Clinical Course     Procedures (including critical care time)  Labs Review Labs Reviewed - No data to display  Imaging Review No results found.    MDM   1. Facial rash   2. Rash and nonspecific skin eruption    Pt c/o itching tingling erythematous rash to face for about 3 days. No known cause. Rash appears c/w roscea despite no prior hx of rosacea.   Rx: erythromycin ointment and hydrocortisone cream. F/u with PCP in 3-4 days if not improving, sooner if worsening.    Noland Fordyce, PA-C 01/19/16 [phone]

## 2016-03-09 ENCOUNTER — Other Ambulatory Visit: Payer: Self-pay | Admitting: Emergency Medicine

## 2016-03-09 MED ORDER — TAMOXIFEN CITRATE 20 MG PO TABS
20.0000 mg | ORAL_TABLET | Freq: Every day | ORAL | 3 refills | Status: DC
Start: 2016-03-09 — End: 2017-04-26

## 2016-03-23 ENCOUNTER — Other Ambulatory Visit: Payer: Self-pay | Admitting: Obstetrics and Gynecology

## 2016-03-23 DIAGNOSIS — R928 Other abnormal and inconclusive findings on diagnostic imaging of breast: Secondary | ICD-10-CM

## 2016-03-25 ENCOUNTER — Ambulatory Visit
Admission: RE | Admit: 2016-03-25 | Discharge: 2016-03-25 | Disposition: A | Discharge status: Home / Self Care | Payer: BLUE CROSS/BLUE SHIELD | Source: Ambulatory Visit | Attending: Obstetrics and Gynecology | Admitting: Obstetrics and Gynecology

## 2016-03-25 DIAGNOSIS — R928 Other abnormal and inconclusive findings on diagnostic imaging of breast: Secondary | ICD-10-CM

## 2016-05-26 ENCOUNTER — Telehealth: Payer: Self-pay | Admitting: Hematology and Oncology

## 2016-05-26 NOTE — Telephone Encounter (Signed)
Patient returned call re 4/20 f/u being moved to 5/2 due to [facility] leaving early. Per patient she cannot come 5/2, she needs a Friday and it has to be a day she is not working. Gave patient f/u for 5/25 @ 10 am - date per patient due to work schedule.

## 2016-05-26 NOTE — Telephone Encounter (Signed)
lvm to inform pt of r/s 4/20 appt 5/2 at 1130 per MD out of office

## 2016-05-29 ENCOUNTER — Ambulatory Visit: Payer: BLUE CROSS/BLUE SHIELD | Admitting: Hematology and Oncology

## 2016-06-10 ENCOUNTER — Ambulatory Visit: Payer: BLUE CROSS/BLUE SHIELD | Admitting: Hematology and Oncology

## 2016-07-03 ENCOUNTER — Ambulatory Visit: Payer: BLUE CROSS/BLUE SHIELD | Admitting: Hematology and Oncology

## 2016-07-03 NOTE — Assessment & Plan Note (Deleted)
LCIS/atypical lobular hyperplasia status post lumpectomy 04/04/2014 Risk assessment: Based on rest cancer risk assessment tool from [place], the lifetime risk of breast cancer with ALH is 35% (average is 12.5%); with LCIS, the risk of recurrence and that 1% per year. This makes it at 40% risk of recurrence. Risk reduction strategies: tamoxifen 20 mg daily for 5 years. This would decrease the risk of breast cancer by 50%. Patient's sister was diagnosed with stage IV breast cancer and she is extremely sad about it.  Tamoxifen toxicities: 1. Muscle aches and pains for which she takes ibuprofen intermittently: I discussed with her to use either collagen or Osteo Bi-Flex  or glucosamine to see if her muscle cramps and aches get better. If none of these worked in instructed her to cut down the dosage of tamoxifen to 10 mg daily. 2. Occasional hot flashes 3. Multiple other health issues including fluctuation of blood pressure, hypokalemia, muscle cramps  Breast Cancer Surveillance: 1. Breast exam 07/03/2016: Normal 2. Mammogram 03/25/2016: No evidence of malignancy benign postsurgical scarring on the right   Return to clinic in 1 year for follow-up

## 2017-04-26 ENCOUNTER — Other Ambulatory Visit: Payer: Self-pay | Admitting: Hematology and Oncology

## 2018-03-15 ENCOUNTER — Telehealth: Payer: Self-pay

## 2018-03-15 NOTE — Telephone Encounter (Signed)
Per 2/4 follow up voice msg return calls. Left a detailed msg. And mailed a letter with a calender enclosed

## 2018-03-21 ENCOUNTER — Telehealth: Payer: Self-pay | Admitting: Hematology and Oncology

## 2018-03-21 ENCOUNTER — Inpatient Hospital Stay: Payer: BLUE CROSS/BLUE SHIELD | Attending: Hematology and Oncology | Admitting: Hematology and Oncology

## 2018-03-21 DIAGNOSIS — Z79810 Long term (current) use of selective estrogen receptor modulators (SERMs): Secondary | ICD-10-CM

## 2018-03-21 DIAGNOSIS — Z803 Family history of malignant neoplasm of breast: Secondary | ICD-10-CM | POA: Diagnosis not present

## 2018-03-21 DIAGNOSIS — Z79899 Other long term (current) drug therapy: Secondary | ICD-10-CM | POA: Insufficient documentation

## 2018-03-21 DIAGNOSIS — D0501 Lobular carcinoma in situ of right breast: Secondary | ICD-10-CM | POA: Insufficient documentation

## 2018-03-21 DIAGNOSIS — Z170 Estrogen receptor positive status [ER+]: Secondary | ICD-10-CM | POA: Insufficient documentation

## 2018-03-21 DIAGNOSIS — Z791 Long term (current) use of non-steroidal anti-inflammatories (NSAID): Secondary | ICD-10-CM | POA: Diagnosis not present

## 2018-03-21 DIAGNOSIS — N951 Menopausal and female climacteric states: Secondary | ICD-10-CM

## 2018-03-21 MED ORDER — B COMPLEX VITAMINS PO CAPS: 1.0000 | ORAL_CAPSULE | Freq: Every day | ORAL | Status: AC

## 2018-03-21 MED ORDER — DULOXETINE HCL 60 MG PO CPEP: 60.0000 mg | ORAL_CAPSULE | Freq: Every day | ORAL | 3 refills | Status: AC

## 2018-03-21 MED ORDER — TAMOXIFEN CITRATE 20 MG PO TABS: 20.0000 mg | ORAL_TABLET | Freq: Every day | ORAL | 3 refills | Status: DC

## 2018-03-21 NOTE — Progress Notes (Signed)
Patient Care Team: Raeanne Gathers, MD as PCP - General (Family Medicine)  DIAGNOSIS:  Encounter Diagnosis  Name Primary?  . Neoplasm of right breast, primary tumor staging category Tis: lobular carcinoma in situ (LCIS)     SUMMARY OF ONCOLOGIC HISTORY:   Neoplasm of right breast, primary tumor staging category Tis: lobular carcinoma in situ (LCIS)   03/13/2014 Initial Diagnosis    Lobular carcinoma in situ    04/04/2014 Surgery    Right breast lumpectomy: Atypical lobular hyperplasia, fibroadenoma 1.1 cm    04/12/2014 -  Anti-estrogen oral therapy    Tamoxifen 20 mg daily 5 years for risk reduction     CHIEF COMPLIANT: Follow-up on tamoxifen therapy  INTERVAL HISTORY: Michelle [name] is a 67-year with above-mentioned history of LCIS who is currently on tamoxifen for risk reduction that started in March 2016.  She has been on it for the past 4 years.  She does have occasional hot flashes as well as muscle aches and pains.  She is planning to see rheumatologist for her aches and pains which she does not attribute to tamoxifen therapy.  Denies any pain lumps or nodules in the breast.  Her mammogram will be done in the next month or so.  [facility]  REVIEW OF SYSTEMS:   Constitutional: Denies fevers, chills or abnormal weight loss Eyes: Denies blurriness of vision Ears, nose, mouth, throat, and face: Denies mucositis or sore throat Respiratory: Denies cough, dyspnea or wheezes Cardiovascular: Denies palpitation, chest discomfort Gastrointestinal:  Denies nausea, heartburn or change in bowel habits Skin: Denies abnormal skin rashes Lymphatics: Denies new lymphadenopathy or easy bruising Neurological: Arthralgias and myalgias Behavioral/Psych: Mood is stable, no new changes  Extremities: No lower extremity edema Breast:  denies any pain or lumps or nodules in either breasts All other systems were reviewed with the patient and are negative.  I  have reviewed the past medical history, past surgical history, social history and family history with the patient and they are unchanged from previous note.  ALLERGIES:  is allergic to sertraline and penicillins.  MEDICATIONS:  Current Outpatient Medications  Medication Sig Dispense Refill  . buPROPion (WELLBUTRIN XL) 300 MG 24 hr tablet Take 300 mg by mouth daily.    . cyclobenzaprine (FLEXERIL) 10 MG tablet Take 10 mg by mouth 3 (three) times daily as needed for muscle spasms.    . Erythromycin 2 % ointment Apply 1 application topically 2 (two) times daily. For 5 days 25 g 0  . hydrocortisone cream 1 % Apply to affected area 2 times daily for 5-7 days 15 g 0  . ibuprofen (ADVIL,MOTRIN) 200 MG tablet Take 400 mg by mouth.    . levothyroxine (SYNTHROID, LEVOTHROID) 125 MCG tablet Take 125 mcg by mouth daily before breakfast.    . Multiple Vitamin (MULTIVITAMIN) capsule Take 1 capsule by mouth.    . Multiple Vitamins-Minerals (MULTIVITAMIN WITH MINERALS) tablet Take 1 tablet by mouth daily.    Marland Kitchen oxyCODONE-acetaminophen (ROXICET) 5-325 MG per tablet Take 1-2 tablets by mouth every 4 (four) hours as needed for severe pain. 30 tablet 0  . primidone (MYSOLINE) 250 MG tablet Take 1 tablet (250 mg total) by mouth at bedtime.    . tamoxifen (NOLVADEX) 20 MG tablet TAKE 1 TABLET (20 MG TOTAL) BY MOUTH DAILY. 90 tablet 0  . tamoxifen (NOLVADEX) 20 MG tablet TAKE 1 TABLET DAILY 90 tablet 3  . UNABLE TO [address]' Wart  1 tab qd.    Marland Kitchen UNABLE TO FIND Take by mouth. Zinc    . vitamin B-12 (CYANOCOBALAMIN) 1000 MCG tablet Take 1,000 mcg by mouth.    . vitamin C (ASCORBIC ACID) 500 MG tablet Take 500 mg by mouth daily.     No current facility-administered medications for this visit.     PHYSICAL EXAMINATION: ECOG PERFORMANCE STATUS: 1 - Symptomatic but completely ambulatory  Vitals:   03/21/18 1028  BP: 140/75  Pulse: (!) 101  Resp: 18  Temp: 98.3 F (36.8 C)  SpO2: 100%   Filed Weights    03/21/18 1028  Weight: 218 lb 1.6 oz (98.9 kg)    GENERAL:alert, no distress and comfortable SKIN: skin color, texture, turgor are normal, no rashes or significant lesions EYES: normal, Conjunctiva are pink and non-injected, sclera clear OROPHARYNX:no exudate, no erythema and lips, buccal mucosa, and tongue normal  NECK: supple, thyroid normal size, non-tender, without nodularity LYMPH:  no palpable lymphadenopathy in the cervical, axillary or inguinal LUNGS: clear to auscultation and percussion with normal breathing effort HEART: regular rate & rhythm and no murmurs and no lower extremity edema ABDOMEN:abdomen soft, non-tender and normal bowel sounds MUSCULOSKELETAL:no cyanosis of digits and no clubbing  NEURO: alert & oriented x 3 with fluent speech, no focal motor/sensory deficits EXTREMITIES: No lower extremity edema BREAST: No palpable masses or nodules in either right or left breasts. No palpable axillary supraclavicular or infraclavicular adenopathy no breast tenderness or nipple discharge. (exam performed in the presence of a chaperone)  LABORATORY DATA:  I have reviewed the data as listed CMP Latest Ref Rng & Units 10/29/2008 08/06/2006  Glucose 70 - 99 mg/dL 90 214(H)  BUN 6 - 23 mg/dL 13 13  Creatinine 0.4 - 1.2 mg/dL 0.7 0.82  Sodium 135 - 145 mEq/L 137 136  Potassium 3.5 - 5.1 mEq/L 3.8 SLIGHT HEMOLYSIS 3.5  Chloride 96 - 112 mEq/L 105 101  CO2 19 - 32 mEq/L 25 27  Calcium 8.4 - 10.5 mg/dL 9.3 8.8  Total Protein - - 6.1  Total Bilirubin - - 0.9  Alkaline Phos - - 83  AST - - 26  ALT - - 21    Lab Results  Component Value Date   WBC 7.3 10/29/2008   HGB 14.5 04/04/2014   HCT 38.0 10/29/2008   MCV 94.4 10/29/2008   PLT 184 10/29/2008   NEUTROABS 4.1 10/29/2008    ASSESSMENT & PLAN:  Neoplasm of right breast, primary tumor staging category Tis: lobular carcinoma in situ (LCIS) LCIS/atypical lobular hyperplasia status post lumpectomy 04/04/2014 Risk assessment:  Based on rest cancer risk assessment tool from [place], the lifetime risk of breast cancer with ALH is 35% (average is 12.5%); with LCIS, the risk of recurrence is 1% per year. This makes it at 40% risk of recurrence. Risk reduction strategies: tamoxifen 20 mg daily for 5 years. This would decrease the risk of breast cancer by 50%. Patient's sister was diagnosed with stage IV breast cancer and she is extremely sad about it.  Tamoxifen toxicities: 1. Muscle aches and pains she is planning to see a rheumatologist.   2. Occasional hot flashes   Breast Cancer Surveillance: 1. Breast exam  to 11/29/2018: Normal 2. [facility]ogram: [facility] is [facility] in February of every year  Return to clinic in 1 year for follow-up   No orders of the defined types were placed in this encounter.  The patient has a good understanding  of the overall plan. she agrees with it. she will call with any problems that may develop before the next visit here.   [name], MD 03/21/18

## 2018-03-21 NOTE — Telephone Encounter (Signed)
Patient decline avs and calendar °

## 2018-03-21 NOTE — Assessment & Plan Note (Signed)
LCIS/atypical lobular hyperplasia status post lumpectomy 04/04/2014 Risk assessment: Based on rest cancer risk assessment tool from [place], the lifetime risk of breast cancer with ALH is 35% (average is 12.5%); with LCIS, the risk of recurrence is 1% per year. This makes it at 40% risk of recurrence. Risk reduction strategies: tamoxifen 20 mg daily for 5 years. This would decrease the risk of breast cancer by 50%. Patient's sister was diagnosed with stage IV breast cancer and she is extremely sad about it.  Tamoxifen toxicities: 1. Muscle aches and pains for which she takes ibuprofen intermittently: I discussed with her to use either collagen or Osteo Bi-Flex on to break or glucosamine to see if her muscle cramps and aches get better. If none of these worked in instructed her to cut down the dosage of tamoxifen to 10 mg daily. 2. Occasional hot flashes 3. Multiple other health issues including fluctuation of blood pressure, hypokalemia, muscle cramps  Breast Cancer Surveillance: 1. Breast exam  to 11/29/2018: Normal 2. Mammogram: at [facility] is generally done in January  Return to clinic in 1 year for follow-up

## 2018-03-30 ENCOUNTER — Emergency Department (HOSPITAL_BASED_OUTPATIENT_CLINIC_OR_DEPARTMENT_OTHER): Payer: BLUE CROSS/BLUE SHIELD

## 2018-03-30 ENCOUNTER — Emergency Department (HOSPITAL_BASED_OUTPATIENT_CLINIC_OR_DEPARTMENT_OTHER)
Admission: EM | Admit: 2018-03-30 | Discharge: 2018-03-30 | Disposition: A | Discharge status: Home / Self Care | Payer: BLUE CROSS/BLUE SHIELD | Attending: Emergency Medicine | Admitting: Emergency Medicine

## 2018-03-30 ENCOUNTER — Other Ambulatory Visit: Payer: Self-pay

## 2018-03-30 ENCOUNTER — Encounter (HOSPITAL_BASED_OUTPATIENT_CLINIC_OR_DEPARTMENT_OTHER): Payer: Self-pay | Admitting: Emergency Medicine

## 2018-03-30 DIAGNOSIS — R079 Chest pain, unspecified: Secondary | ICD-10-CM | POA: Insufficient documentation

## 2018-03-30 DIAGNOSIS — M546 Pain in thoracic spine: Secondary | ICD-10-CM | POA: Diagnosis not present

## 2018-03-30 DIAGNOSIS — E039 Hypothyroidism, unspecified: Secondary | ICD-10-CM | POA: Diagnosis not present

## 2018-03-30 DIAGNOSIS — Z79899 Other long term (current) drug therapy: Secondary | ICD-10-CM | POA: Insufficient documentation

## 2018-03-30 DIAGNOSIS — M549 Dorsalgia, unspecified: Secondary | ICD-10-CM

## 2018-03-30 DIAGNOSIS — F329 Major depressive disorder, single episode, unspecified: Secondary | ICD-10-CM | POA: Insufficient documentation

## 2018-03-30 DIAGNOSIS — F419 Anxiety disorder, unspecified: Secondary | ICD-10-CM | POA: Insufficient documentation

## 2018-03-30 DIAGNOSIS — R0602 Shortness of breath: Secondary | ICD-10-CM | POA: Diagnosis not present

## 2018-03-30 LAB — CBC WITH DIFFERENTIAL/PLATELET
ABS IMMATURE GRANULOCYTES: 0.02 10*3/uL (ref 0.00–0.07)
BASOS PCT: 1 %
Basophils Absolute: 0 10*3/uL (ref 0.0–0.1)
Eosinophils Absolute: 0.2 10*3/uL (ref 0.0–0.5)
Eosinophils Relative: 2 %
HCT: 41.1 % (ref 36.0–46.0)
HEMOGLOBIN: 13.5 g/dL (ref 12.0–15.0)
Immature Granulocytes: 0 %
Lymphocytes Relative: 22 %
Lymphs Abs: 1.7 10*3/uL (ref 0.7–4.0)
MCH: 31.3 pg (ref 26.0–34.0)
MCHC: 32.8 g/dL (ref 30.0–36.0)
MCV: 95.1 fL (ref 80.0–100.0)
Monocytes Absolute: 0.5 10*3/uL (ref 0.1–1.0)
Monocytes Relative: 7 %
NEUTROS ABS: 5.4 10*3/uL (ref 1.7–7.7)
Neutrophils Relative %: 68 %
Platelets: 224 10*3/uL (ref 150–400)
RBC: 4.32 MIL/uL (ref 3.87–5.11)
RDW: 12.3 % (ref 11.5–15.5)
WBC: 7.9 10*3/uL (ref 4.0–10.5)
nRBC: 0 % (ref 0.0–0.2)

## 2018-03-30 LAB — BASIC METABOLIC PANEL
Anion gap: 8 (ref 5–15)
BUN: 19 mg/dL (ref 6–20)
CHLORIDE: 109 mmol/L (ref 98–111)
CO2: 23 mmol/L (ref 22–32)
Calcium: 9.3 mg/dL (ref 8.9–10.3)
Creatinine, Ser: 0.79 mg/dL (ref 0.44–1.00)
GFR calc Af Amer: >60 mL/min (ref >60)
GFR calc non Af Amer: >60 mL/min (ref >60)
Glucose, Bld: 98 mg/dL (ref 70–99)
Potassium: 4.5 mmol/L (ref 3.5–5.1)
Sodium: 140 mmol/L (ref 135–145)

## 2018-03-30 LAB — URINALYSIS, ROUTINE W REFLEX MICROSCOPIC
Bilirubin Urine: NEGATIVE
Glucose, UA: NEGATIVE mg/dL
Hgb urine dipstick: NEGATIVE
Ketones, ur: NEGATIVE mg/dL
Leukocytes,Ua: NEGATIVE
Nitrite: NEGATIVE
Protein, ur: NEGATIVE mg/dL
SPECIFIC GRAVITY, URINE: 1.025 (ref 1.005–1.030)
pH: 6 (ref 5.0–8.0)

## 2018-03-30 LAB — PREGNANCY, URINE: Preg Test, Ur: NEGATIVE

## 2018-03-30 LAB — TROPONIN I
Troponin I: <0.03 ng/mL (ref <0.03)
Troponin I: <0.03 ng/mL (ref <0.03)

## 2018-03-30 LAB — D-DIMER, QUANTITATIVE: D-Dimer, Quant: 0.31 ug/mL-FEU (ref 0.00–0.50)

## 2018-03-30 MED ORDER — METHOCARBAMOL 500 MG PO TABS
500.0000 mg | ORAL_TABLET | Freq: Once | ORAL | Status: AC
  Administered 2018-03-30: 500 mg via ORAL
  Filled 2018-03-30: qty 1

## 2018-03-30 MED ORDER — LIDOCAINE 5 % EX PTCH: 1.0000 | MEDICATED_PATCH | CUTANEOUS | 0 refills | Status: AC

## 2018-03-30 MED ORDER — NAPROXEN 500 MG PO TABS: 500.0000 mg | ORAL_TABLET | Freq: Two times a day (BID) | ORAL | 0 refills | Status: DC

## 2018-03-30 MED ORDER — METHOCARBAMOL 500 MG PO TABS: 500.0000 mg | ORAL_TABLET | Freq: Three times a day (TID) | ORAL | 0 refills | Status: DC | PRN

## 2018-03-30 MED ORDER — KETOROLAC TROMETHAMINE 15 MG/ML IJ SOLN
15.0000 mg | Freq: Once | INTRAMUSCULAR | Status: AC
  Administered 2018-03-30: 15 mg via INTRAVENOUS
  Filled 2018-03-30: qty 1

## 2018-03-30 MED ORDER — LIDOCAINE 5 % EX PTCH
1.0000 | MEDICATED_PATCH | Freq: Once | CUTANEOUS | Status: DC
  Filled 2018-03-30: qty 1

## 2018-03-30 MED ORDER — METHOCARBAMOL 500 MG PO TABS: 500.0000 mg | ORAL_TABLET | Freq: Three times a day (TID) | ORAL | 0 refills | Status: AC | PRN

## 2018-03-30 MED ORDER — HYDROMORPHONE HCL 1 MG/ML IJ SOLN
1.0000 mg | Freq: Once | INTRAMUSCULAR | Status: AC
  Administered 2018-03-30: 1 mg via INTRAVENOUS
  Filled 2018-03-30: qty 1

## 2018-03-30 MED ORDER — SODIUM CHLORIDE 0.9 % IV BOLUS
1000.0000 mL | Freq: Once | INTRAVENOUS | Status: AC
  Administered 2018-03-30: 1000 mL via INTRAVENOUS

## 2018-03-30 MED ORDER — MORPHINE SULFATE (PF) 4 MG/ML IV SOLN
4.0000 mg | Freq: Once | INTRAVENOUS | Status: AC
  Administered 2018-03-30: 4 mg via INTRAVENOUS
  Filled 2018-03-30: qty 1

## 2018-03-30 MED ORDER — ONDANSETRON HCL 4 MG/2ML IJ SOLN
4.0000 mg | Freq: Once | INTRAMUSCULAR | Status: AC
  Administered 2018-03-30: 4 mg via INTRAVENOUS
  Filled 2018-03-30: qty 2

## 2018-03-30 MED ORDER — NAPROXEN 500 MG PO TABS: 500.0000 mg | ORAL_TABLET | Freq: Two times a day (BID) | ORAL | 0 refills | Status: AC

## 2018-03-30 MED ORDER — LIDOCAINE 5 % EX PTCH: 1.0000 | MEDICATED_PATCH | CUTANEOUS | 0 refills | Status: DC

## 2018-03-30 MED ORDER — IOPAMIDOL (ISOVUE-370) INJECTION 76%
100.0000 mL | Freq: Once | INTRAVENOUS | Status: AC | PRN
  Administered 2018-03-30: 100 mL via INTRAVENOUS

## 2018-03-30 MED FILL — NAPROXEN 500 MG TABLET: 500 | 5 days supply | Qty: 10 | Fill #0

## 2018-03-30 MED FILL — METHOCARBAMOL 500 MG TABLET: 500 | 5 days supply | Qty: 15 | Fill #0

## 2018-03-30 MED FILL — LIDOCAINE PATCH 5%: 5 | 30 days supply | Qty: 30 | Fill #0

## 2018-03-30 NOTE — ED Provider Notes (Signed)
[facility] EMERGENCY DEPARTMENT Provider Note   CSN: [id number] Arrival date & time: 03/30/18  4196    History   Chief Complaint Chief Complaint  Patient presents with  . Back Pain    HPI Michelle [name] is a 53 y.o. female with a hx of anxiety, depression, hypothyroidism, and breast cancer s/p lumpectomy in 2016 which did not require further intervention who presents to the ED with complaints of back pain that began upon waking up this AM. Patient describes the pain as being located to the L upper back/flank & radiating to L anterior chest wall. She states pain is sharp in nature, currently a 7/10 in severity, worse with a deep breath & with movement. No alleviating factors. She tried 800 mg of Advil PTA without relief. She notes associated shortness of breath. No hx of similar pain. Denies fever, chills, nausea, vomiting, diaphoresis, syncope, dizziness, numbness, weakness, cough, leg pain/swelling, hemoptysis, recent surgery/trauma, recent long travel, hormone use, or hx of DVT/PE. No early family hx of CAD. Denies recent heavy lifting, activity change, or trauma.      HPI  Past Medical History:  Diagnosis Date  . Anxiety   . Atypical lobular hyperplasia of right breast   . DDD (degenerative disc disease), cervical   . Depression   . Family history of breast cancer   . Hypothyroidism   . Leg cramps   . Wears glasses     Patient Active Problem List   Diagnosis Date Noted  . Genetic testing 05/16/2014  . Atypical lobular hyperplasia of right breast   . Family history of breast cancer   . Neoplasm of right breast, primary tumor staging category Tis: lobular carcinoma in situ (LCIS) 04/12/2014    Past Surgical History:  Procedure Laterality Date  . BACK SURGERY     Fusion C3-C6  . BREAST BIOPSY Right 04/03/2014   benign excisional  . [place]   right  . CERVICAL FUSION  2005  . DILATION AND CURETTAGE OF UTERUS    . KNEE ARTHROSCOPY   1979,2000   right  . RADIOACTIVE SEED GUIDED EXCISIONAL BREAST BIOPSY Right 04/04/2014   Procedure: RADIOACTIVE SEED GUIDED EXCISIONAL RIGHT BREAST BIOPSY;  Surgeon: [name], MD;  Location: [facility];  Service: General;  Laterality: Right;  . TONSILLECTOMY    . TOTAL VAGINAL HYSTERECTOMY  2008  . TUBAL LIGATION       OB History   No obstetric history on file.      Home Medications    Prior to Admission medications   Medication Sig Start Date End Date Taking? Authorizing Provider  b complex vitamins capsule Take 1 capsule by mouth daily. 03/21/18   Nicholas Lose, MD  buPROPion (WELLBUTRIN XL) 300 MG 24 hr tablet Take 300 mg by mouth daily.    [provider]  DULoxetine (CYMBALTA) 60 MG capsule Take 1 capsule (60 mg total) by mouth daily. 03/21/18   Nicholas Lose, MD  ibuprofen (ADVIL,MOTRIN) 200 MG tablet Take 400 mg by mouth.    [provider]  levothyroxine (SYNTHROID, LEVOTHROID) 125 MCG tablet Take 125 mcg by mouth daily before breakfast.    [provider]  Multiple Vitamin (MULTIVITAMIN) capsule Take 1 capsule by mouth.    [provider]  Multiple Vitamins-Minerals (MULTIVITAMIN WITH MINERALS) tablet Take 1 tablet by mouth daily.    [provider]  tamoxifen (NOLVADEX) 20 MG tablet Take 1 tablet (20 mg total) by  mouth daily. 03/21/18   Nicholas Lose, MD  vitamin B-12 (CYANOCOBALAMIN) 1000 MCG tablet Take 1,000 mcg by mouth.    [provider]  vitamin C (ASCORBIC ACID) 500 MG tablet Take 500 mg by mouth daily.    [provider]    Family History Family History  Problem Relation Age of Onset  . Breast cancer Sister 68       bilateral ; currently 89    Social History Social History   Tobacco Use  . Smoking status: Never Smoker  . Smokeless tobacco: Never Used  Substance Use Topics  . Alcohol use: Yes    Alcohol/week: 3.0 standard drinks    Types: 3 Glasses of wine per week     Comment: occ  . Drug use: No     Allergies   Sertraline and Penicillins   Review of Systems Review of Systems  Constitutional: Negative for chills and fever.  Respiratory: Positive for shortness of breath. Negative for cough.        Negative for hemoptysis.   Cardiovascular: Positive for chest pain. Negative for palpitations and leg swelling.  Gastrointestinal: Negative for anal bleeding, diarrhea, nausea and vomiting.  Genitourinary: Negative for dysuria.  Musculoskeletal: Positive for back pain.  Neurological: Negative for weakness and numbness.  All other systems reviewed and are negative.    Physical Exam Updated Vital Signs BP (!) 159/72 (BP Location: Right Arm)   Pulse 99   Temp 98.2 F (36.8 C) (Oral)   Resp 18   Ht 5\' 4"  (1.626 m)   Wt 97.5 kg   SpO2 99%   BMI 36.90 kg/m   Physical Exam Vitals signs and nursing note reviewed.  Constitutional:      General: She is not in acute distress.    Appearance: She is well-developed. She is not toxic-appearing.  HENT:     Head: Normocephalic and atraumatic.  Eyes:     General:        Right eye: No discharge.        Left eye: No discharge.     Conjunctiva/sclera: Conjunctivae normal.  Neck:     Musculoskeletal: Neck supple.  Cardiovascular:     Rate and Rhythm: Normal rate and regular rhythm.     Pulses:          Radial pulses are 2+ on the right side and 2+ on the left side.     Heart sounds: No murmur. No friction rub.  Pulmonary:     Effort: Pulmonary effort is normal. No respiratory distress.     Breath sounds: Normal breath sounds. No wheezing, rhonchi or rales.  Chest:     Comments: Patient has left upper to mid left sided thoracic paraspinal muscle tenderness to the posterior chest wall. Also w/ some mild L anterior chest wall tenderness. No midline thoracic tenderness. No overlying skin changes, no vesicular rash, no ecchymosis. No palpable crepitus.  Abdominal:     General: There is no distension.      Palpations: Abdomen is soft.     Tenderness: There is no abdominal tenderness.  Musculoskeletal:        General: No swelling or tenderness.     Right lower leg: No edema.     Left lower leg: No edema.  Skin:    General: Skin is warm and dry.     Findings: No rash.  Neurological:     Mental Status: She is alert.     Comments: Clear speech. 5/5  symmetric grip strength. Sensation grossly intact to bilateral upper extremities. Ambulatory.   Psychiatric:        Behavior: Behavior normal.      ED Treatments / Results  Labs (all labs ordered are listed, but only abnormal results are displayed) Labs Reviewed  CBC WITH DIFFERENTIAL/PLATELET  BASIC METABOLIC PANEL  TROPONIN I  D-DIMER, QUANTITATIVE (NOT AT [facility])  PREGNANCY, URINE  URINALYSIS, ROUTINE W REFLEX MICROSCOPIC  TROPONIN I    EKG EKG Interpretation  Date/Time:  Wednesday March 30 2018 09:49:35 EST Ventricular Rate:  95 PR Interval:    QRS Duration: 109 QT Interval:  371 QTC Calculation: 467 R Axis:   41 Text Interpretation:  Sinus rhythm Confirmed by Davonna Belling [phone]) on 03/30/2018 11:25:56 AM   Radiology Dg Chest 2 View  Result Date: 03/30/2018 CLINICAL DATA:  Chest pain EXAM: CHEST - 2 VIEW COMPARISON:  October 29, 2008 FINDINGS: No edema consolidation. The heart size and pulmonary vascularity are normal. No adenopathy. There is degenerative change in the thoracic spine. There is postoperative change in the lower cervical region. No pneumothorax. IMPRESSION: No edema or consolidation. Electronically Signed   By: Lowella Grip III M.D.   On: 03/30/2018 10:51   Ct Angio Chest Pe W/cm &/or Wo Cm  Result Date: 03/30/2018 CLINICAL DATA:  Shortness of breath and chest pain EXAM: CT ANGIOGRAPHY CHEST WITH CONTRAST TECHNIQUE: Multidetector CT imaging of the chest was performed using the standard protocol during bolus administration of intravenous contrast. Multiplanar CT image reconstructions and MIPs  were obtained to evaluate the vascular anatomy. CONTRAST:  185mL ISOVUE-370 IOPAMIDOL (ISOVUE-370) INJECTION 76% COMPARISON:  Chest radiograph March 30, 2018 FINDINGS: Cardiovascular: There is no demonstrable pulmonary embolus. There is no thoracic aortic aneurysm or dissection. The visualized great vessels appear unremarkable. There is no pericardial effusion or pericardial thickening. Mediastinum/Nodes: Visualized thyroid appears normal. There are occasional subcentimeter mediastinal lymph nodes. By size criteria, there is no thoracic adenopathy. No esophageal lesions are evident. Lungs/Pleura: There is no appreciable edema or consolidation. No pleural effusion or pleural thickening is evident. Upper Abdomen: There is a degree of hepatic steatosis. Visualized upper abdominal structures otherwise appear normal. Musculoskeletal: There is postoperative change in the lower cervical spine. There are multiple foci of degenerative change in the thoracic spine. There are no blastic or lytic bone lesions. No chest wall lesions are evident. Review of the MIP images confirms the above findings. IMPRESSION: 1. No demonstrable pulmonary embolus. No thoracic aortic aneurysm or dissection. 2.  No edema or consolidation. 3.  No demonstrable thoracic adenopathy. 4.  Hepatic steatosis. Electronically Signed   By: Lowella Grip III M.D.   On: 03/30/2018 13:00    Procedures Procedures (including critical care time)  Medications Ordered in ED Medications  lidocaine (LIDODERM) 5 % 1 patch (1 patch Transdermal Not Given 03/30/18 1157)  ondansetron (ZOFRAN) injection 4 mg (4 mg Intravenous Given 03/30/18 1034)  morphine 4 MG/ML injection 4 mg (4 mg Intravenous Given 03/30/18 1034)  sodium chloride 0.9 % bolus 1,000 mL ( Intravenous Stopped 03/30/18 1207)  methocarbamol (ROBAXIN) tablet 500 mg (500 mg Oral Given 03/30/18 1153)  HYDROmorphone (DILAUDID) injection 1 mg (1 mg Intravenous Given 03/30/18 1231)  iopamidol  (ISOVUE-370) 76 % injection 100 mL (100 mLs Intravenous Contrast Given 03/30/18 1237)  ketorolac (TORADOL) 15 MG/ML injection 15 mg (15 mg Intravenous Given 03/30/18 1401)     Initial Impression / Assessment and Plan / ED Course  I have reviewed the triage  vital signs and the nursing notes.  Pertinent labs & imaging results that were available during my care of the patient were reviewed by me and considered in my medical decision making (see chart for details).   Patient presents to the ED with complaints of L sided upper back pain that radiates to the chest w/ associated dyspnea. Nontoxic appearing, vitals WNL other than elevated BP on my initial assessment. Exam with some reproducibility w/ chest wall palpation, however patient is unsure if this is really the pain she is experiencing or if it is different with palpation. Exam otherwise benign. Heart RRR. Lungs CTA.  DDX: ACS, pulmonary embolism, dissection, pneumothorax, effusion, infiltrate, arrhythmia, anemia, electrolyte derangement, MSK, GERD, shingles. Evaluation initiated with labs- including d-dimer & troponin, EKG, and CXR. Patient on cardiac monitor.   Work-up reviewed:  CBC: No anemia or leukocytosis.  BMP: No electrolyte disturbance D-dimer: Negative Troponin: negative UA: No hematuria to suggest nephrolithiasis, no UTI.  EKG: NSR TDS:[id number] for infiltrate, edema, effusion, or pneumothorax.   On re-evaluation following initial work-up patient with persistent discomfort following morphine.  We discussed potential of muscular or pleurisy regarding etiology. Discussed that d-dimer is negative making PE very unlikely, however patient with persistent pain following additional analgesics expressing concern that it is not muscular, risks/benefits/options discussed, would prefer CTA to more definitively rule/out blood clot which is feel is reasonable based on her presentation.   CTA negative for PE or dissection. Low risk heart score of  2, EKG without obvious ischemia, delta troponin negative, doubt ACS.  Cardiac monitor reviewed. No overlying vesicular rash to suggest shingles at this time, would consider if rash develops given somewhat dermatomal pattern. At this time unclear etiology possibly muscular or pleuritic. Will trial NSAIDs & muscle relaxant w/ close PCP follow up and strict return precautions.  Patient has appeared hemodynamically stable throughout ER visit and appears safe for discharge with close PCP follow up. I discussed results, treatment plan, need for PCP follow-up, and return precautions with the patient. Provided opportunity for questions, patient confirmed understanding and is in agreement with plan.    Findings and plan of care discussed with supervising physician Dr. Alvino Chapel who is in agreement.   Final Clinical Impressions(s) / ED Diagnoses   Final diagnoses:  Upper back pain    ED Discharge Orders         Ordered    naproxen (NAPROSYN) 500 MG tablet  2 times daily     03/30/18 1452    methocarbamol (ROBAXIN) 500 MG tablet  Every 8 hours PRN     03/30/18 1452    lidocaine (LIDODERM) 5 %  Every 24 hours     03/30/18 1452           Kayshaun Polanco, Glynda Jaeger, PA-C 03/30/18 1518    Davonna Belling, MD 03/31/18 [phone]

## 2018-03-30 NOTE — Discharge Instructions (Addendum)
You were seen in the emergency department today for chest pain. Your work-up in the emergency department has been overall reassuring. Your labs have been fairly normal and or similar to previous blood work you have had done. Your EKG and the enzyme we use to check your heart did not show an acute heart attack at this time. Your chest x-ray was normal. Your CT scan showed some fatty liver changes, but no blood clot.  We are sending you home with the following medicines:  - Naproxen is a nonsteroidal anti-inflammatory medication that will help with pain and swelling. Be sure to take this medication as prescribed with food, 1 pill every 12 hours,  It should be taken with food, as it can cause stomach upset, and more seriously, stomach bleeding. Do not take other nonsteroidal anti-inflammatory medications with this such as Advil, Motrin, Aleve, Mobic, Goodie Powder, or Motrin.    - Robaxin is the muscle relaxer I have prescribed, this is meant to help with muscle tightness. Be aware that this medication may make you drowsy therefore the first time you take this it should be at a time you are in an environment where you can rest. Do not drive or operate heavy machinery when taking this medication. Do not drink alcohol or take other sedating medications with this medicine such as narcotics or benzodiazepines.   - Lidoderm patch: Patch to use over area of pain once per day.   You make take Tylenol per over the counter dosing with these medications.   We have prescribed you new medication(s) today. Discuss the medications prescribed today with your pharmacist as they can have adverse effects and interactions with your other medicines including over the counter and prescribed medications. Seek medical evaluation if you start to experience new or abnormal symptoms after taking one of these medicines, seek care immediately if you start to experience difficulty breathing, feeling of your throat closing, facial  swelling, or rash as these could be indications of a more serious allergic reaction      We would like you to follow up closely with your primary care provider within 1-3 days. Return to the ER immediately should you experience any new or worsening symptoms including but not limited to return of pain, worsened pain, vomiting, shortness of breath, dizziness, lightheadedness, passing out, rash, or any other concerns that you may have.

## 2018-03-30 NOTE — ED Notes (Signed)
Pt in restroom 

## 2018-03-30 NOTE — ED Notes (Signed)
Patient transported to X-ray 

## 2018-03-30 NOTE — ED Triage Notes (Addendum)
Pt c/o left side upper back pain that radiates into left breast that started this morning upon awakening. Pt also c/o mild sob. Denies N/V, denies chest pain.

## 2018-03-30 NOTE — ED Notes (Signed)
Pt enrolled in aromatherapy pain trial 

## 2019-03-08 ENCOUNTER — Telehealth: Payer: Self-pay | Admitting: Hematology and Oncology

## 2019-03-08 NOTE — Telephone Encounter (Signed)
Rescheduled per MD. Hulen Skains and spoke with pt, confirmed 2/19 appt

## 2019-03-27 ENCOUNTER — Ambulatory Visit: Payer: BLUE CROSS/BLUE SHIELD | Admitting: Hematology and Oncology

## 2019-03-30 ENCOUNTER — Telehealth (HOSPITAL_BASED_OUTPATIENT_CLINIC_OR_DEPARTMENT_OTHER): Payer: BC Managed Care – PPO | Admitting: Hematology and Oncology

## 2019-03-30 DIAGNOSIS — D0501 Lobular carcinoma in situ of right breast: Secondary | ICD-10-CM

## 2019-03-30 NOTE — Assessment & Plan Note (Addendum)
LCIS/atypical lobular hyperplasia status post lumpectomy 04/04/2014 Risk assessment: Based on rest cancer risk assessment tool from [place], the lifetime risk of breast cancer with ALH is 35% (average is 12.5%); with LCIS, the risk of recurrence is 1% per year. This makes it at 40% risk of recurrence. Risk reduction strategies: tamoxifen 20 mg daily for 5 years started 04/12/2014.  Patient's sister was diagnosed with stage IV breast cancer and she is extremely sad about it.  Tamoxifen toxicities: 1. Muscle aches and pains  2. Occasional hot flashes 3. Multiple other health issues including fluctuation of blood pressure, hypokalemia, muscle cramps  Breast Cancer Surveillance: 1. Breast exam 11/29/2018: Normal 2. [facility]  Return to clinic in 1 year for follow-up

## 2019-03-30 NOTE — Progress Notes (Signed)
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  Patient Care Team: Raeanne Gathers, MD as PCP - General (Family Medicine)  DIAGNOSIS:  Encounter Diagnosis  Name Primary?  . Neoplasm of right breast, primary tumor staging category Tis: lobular carcinoma in situ (LCIS)     SUMMARY OF ONCOLOGIC HISTORY: Oncology History  Neoplasm of right breast, primary tumor staging category Tis: lobular carcinoma in situ (LCIS)  03/13/2014 Initial Diagnosis   Lobular carcinoma in situ   04/04/2014 Surgery   Right breast lumpectomy: Atypical lobular hyperplasia, fibroadenoma 1.1 cm   04/12/2014 -  Anti-estrogen oral therapy   Tamoxifen 20 mg daily 5 years for risk reduction     CHIEF COMPLIANT: Follow-up and surveillance of history of LCIS  INTERVAL HISTORY: Michelle [name] is a 53 year old with above-mentioned history of LCIS diagnosed in 2016 status post lumpectomy and tamoxifen.  She completed 5 years of tamoxifen therapy.  She had lots of joint and muscle aches and pains.  She continues to suffer from osteoarthritis.  She is very happy to have completed her 5 years and is excited to come off of the medication.   ALLERGIES:  is allergic to sertraline and penicillins.  MEDICATIONS:  Current Outpatient Medications  Medication Sig Dispense Refill  . b complex vitamins capsule Take 1 capsule by mouth daily.    Marland Kitchen buPROPion (WELLBUTRIN XL) 300 MG 24 hr tablet Take 300 mg by mouth daily.    . DULoxetine (CYMBALTA) 60 MG capsule Take 1 capsule (60 mg total) by mouth daily.  3  . ibuprofen (ADVIL,MOTRIN) 200 MG tablet Take 400 mg by mouth.    . levothyroxine (SYNTHROID, LEVOTHROID) 125 MCG tablet Take 125 mcg by mouth daily before breakfast.    . lidocaine (LIDODERM) 5 % Place 1 patch onto the skin daily. Remove & Discard patch within 12 hours or as directed by MD 30 patch 0  . methocarbamol (ROBAXIN) 500 MG tablet Take 1 tablet (500 mg total) by mouth every 8 (eight) hours as  needed for muscle spasms. 15 tablet 0  . Multiple Vitamin (MULTIVITAMIN) capsule Take 1 capsule by mouth.    . Multiple Vitamins-Minerals (MULTIVITAMIN WITH MINERALS) tablet Take 1 tablet by mouth daily.    . naproxen (NAPROSYN) 500 MG tablet Take 1 tablet (500 mg total) by mouth 2 (two) times daily. 10 tablet 0  . tamoxifen (NOLVADEX) 20 MG tablet Take 1 tablet (20 mg total) by mouth daily. 90 tablet 3  . vitamin B-12 (CYANOCOBALAMIN) 1000 MCG tablet Take 1,000 mcg by mouth.    . vitamin C (ASCORBIC ACID) 500 MG tablet Take 500 mg by mouth daily.     No current facility-administered medications for this visit.    PHYSICAL EXAMINATION: ECOG PERFORMANCE STATUS: 1 - Symptomatic but completely ambulatory  LABORATORY DATA:  I have reviewed the data as listed CMP Latest Ref Rng & Units 03/30/2018 10/29/2008 08/06/2006  Glucose 70 - 99 mg/dL 98 90 214(H)  BUN 6 - 20 mg/dL 19 13 13   Creatinine 0.44 - 1.00 mg/dL 0.79 0.7 0.82  Sodium 135 - 145 mmol/L 140 137 136  Potassium 3.5 - 5.1 mmol/L 4.5 3.8 SLIGHT HEMOLYSIS 3.5  Chloride 98 - 111 mmol/L 109 105 101  CO2 22 - 32 mmol/L 23 25 27   Calcium 8.9 - 10.3 mg/dL 9.3 9.3 8.8  Total Protein - - - 6.1  Total Bilirubin - - - 0.9  Alkaline Phos - - - 83  AST - - -  26  ALT - - - 21    Lab Results  Component Value Date   WBC 7.9 03/30/2018   HGB 13.5 03/30/2018   HCT 41.1 03/30/2018   MCV 95.1 03/30/2018   PLT 224 03/30/2018   NEUTROABS 5.4 03/30/2018    ASSESSMENT & PLAN:  Neoplasm of right breast, primary tumor staging category Tis: lobular carcinoma in situ (LCIS) LCIS/atypical lobular hyperplasia status post lumpectomy 04/04/2014 Risk assessment: Based on rest cancer risk assessment tool from [place], the lifetime risk of breast cancer with ALH is 35% (average is 12.5%); with LCIS, the risk of recurrence is 1% per year. This makes it at 40% risk of recurrence. Risk reduction strategies: tamoxifen 20 mg daily for 5 years  started 04/12/2014.  Patient's sister was diagnosed with stage IV breast cancer and she is extremely sad about it.  Tamoxifen toxicities: 1. Muscle aches and pains  2. Occasional hot flashes 3. Multiple other health issues including fluctuation of blood pressure, hypokalemia, muscle cramps  Breast Cancer Surveillance: 1. Breast exam 11/29/2018: Normal 2. [facility] July 2020  Return to clinic on an as-needed basis    No orders of the defined types were placed in this encounter.  The patient has a good understanding of the overall plan. she agrees with it. she will call with any problems that may develop before the next visit here. Total time spent: 30 mins including face to face time and time spent for planning, charting and co-ordination of care   [name], MD 03/30/19

## 2019-03-31 ENCOUNTER — Inpatient Hospital Stay: Payer: BLUE CROSS/BLUE SHIELD | Admitting: Hematology and Oncology

## 2023-03-10 ENCOUNTER — Ambulatory Visit
Admission: EM | Admit: 2023-03-10 | Discharge: 2023-03-10 | Disposition: A | Discharge status: Home / Self Care | Payer: BC Managed Care – PPO

## 2023-03-10 DIAGNOSIS — E860 Dehydration: Secondary | ICD-10-CM

## 2023-03-10 DIAGNOSIS — R319 Hematuria, unspecified: Secondary | ICD-10-CM

## 2023-03-10 LAB — POCT URINALYSIS DIP (MANUAL ENTRY)
Glucose, UA: NEGATIVE mg/dL
Leukocytes, UA: NEGATIVE
Nitrite, UA: NEGATIVE
Protein Ur, POC: 30 mg/dL — AB
Spec Grav, UA: >=1.03 — AB (ref 1.010–1.025)
Urobilinogen, UA: 0.2 U/dL
pH, UA: 6 (ref 5.0–8.0)

## 2023-03-10 NOTE — ED Provider Notes (Addendum)
[facility]    CSN: [id number] Arrival date & time: 03/10/23  1536      History   Chief Complaint Chief Complaint  Patient presents with   Diarrhea    HPI Michelle [name] is a 58 y.o. female.   HPI Pleasant 58 year old female presents with intermittent diarrhea since 12/31.  Patient reports is currently being treated by PCP with [facility] [place], [place] for parasite found on 02/24/2023 and stool culture.  Patient is currently on Bactrim twice daily for 14 days per her provider PMH significant for obesity, atypical lobular hyperplasia of right breast, and anxiety.  Patient is accompanied by her husband this afternoon.  Past Medical History:  Diagnosis Date   Anxiety    Atypical lobular hyperplasia of right breast    DDD (degenerative disc disease), cervical    Depression    Family history of breast cancer    Hypothyroidism    Leg cramps    Wears glasses     Patient Active Problem List   Diagnosis Date Noted   Genetic testing 05/16/2014   Atypical lobular hyperplasia of right breast    Family history of breast cancer    Neoplasm of right breast, primary tumor staging category Tis: lobular carcinoma in situ (LCIS) 04/12/2014    Past Surgical History:  Procedure Laterality Date   BACK SURGERY     Fusion C3-C6   BREAST BIOPSY Right 04/03/2014   benign excisional   CARPAL TUNNEL RELEASE  1993   right   CERVICAL FUSION  2005   DILATION AND CURETTAGE OF UTERUS     KNEE ARTHROSCOPY  1979,2000   right   RADIOACTIVE SEED GUIDED EXCISIONAL BREAST BIOPSY Right 04/04/2014   Procedure: RADIOACTIVE SEED GUIDED EXCISIONAL RIGHT BREAST BIOPSY;  Surgeon: Almond Lint, MD;  Location: [facility];  Service: General;  Laterality: Right;   TONSILLECTOMY     TOTAL VAGINAL HYSTERECTOMY  2008   TUBAL LIGATION      OB History   No obstetric history on file.      Home Medications    Prior to Admission medications   Medication  Sig Start Date End Date Taking? Authorizing Provider  NP THYROID 60 MG tablet Take 60 mg by mouth daily. 01/24/23  Yes [provider]  sulfamethoxazole-trimethoprim (BACTRIM DS) 800-160 MG tablet Take 1 tablet by mouth 2 (two) times daily. 03/03/23  Yes [provider]  b complex vitamins capsule Take 1 capsule by mouth daily. 03/21/18   Serena Croissant, MD  buPROPion (WELLBUTRIN XL) 300 MG 24 hr tablet Take 300 mg by mouth daily.    [provider]  DULoxetine (CYMBALTA) 60 MG capsule Take 1 capsule (60 mg total) by mouth daily. 03/21/18   Serena Croissant, MD  ibuprofen (ADVIL,MOTRIN) 200 MG tablet Take 400 mg by mouth.    [provider]  levothyroxine (SYNTHROID, LEVOTHROID) 125 MCG tablet Take 125 mcg by mouth daily before breakfast.    [provider]  lidocaine (LIDODERM) 5 % Place 1 patch onto the skin daily. Remove & Discard patch within 12 hours or as directed by MD 03/30/18   Petrucelli, Pleas Koch, PA-C  methocarbamol (ROBAXIN) 500 MG tablet Take 1 tablet (500 mg total) by mouth every 8 (eight) hours as needed for muscle spasms. 03/30/18   Petrucelli, Samantha R, PA-C  Multiple Vitamin (MULTIVITAMIN) capsule Take 1 capsule by mouth.    [provider]  Multiple Vitamins-Minerals (MULTIVITAMIN WITH MINERALS) tablet Take  1 tablet by mouth daily.    [provider]  naproxen (NAPROSYN) 500 MG tablet Take 1 tablet (500 mg total) by mouth 2 (two) times daily. 03/30/18   Petrucelli, Samantha R, PA-C  vitamin B-12 (CYANOCOBALAMIN) 1000 MCG tablet Take 1,000 mcg by mouth.    [provider]  vitamin C (ASCORBIC ACID) 500 MG tablet Take 500 mg by mouth daily.    [provider]    Family History Family History  Problem Relation Age of Onset   Breast cancer Sister 76       bilateral ; currently 25    Social History Social History   Tobacco Use   Smoking status: Never   Smokeless tobacco: Never  Substance Use Topics    Alcohol use: Yes    Alcohol/week: 3.0 standard drinks of alcohol    Types: 3 Glasses of wine per week    Comment: occ   Drug use: No     Allergies   Sertraline and Penicillins   Review of Systems Review of Systems  Gastrointestinal:  Positive for diarrhea and nausea.  All other systems reviewed and are negative.    Physical Exam Triage Vital Signs ED Triage Vitals  Encounter Vitals Group     BP 03/10/23 1549 (!) 153/80     Systolic BP Percentile --      Diastolic BP Percentile --      Pulse Rate 03/10/23 1549 (!) 58     Resp 03/10/23 1549 18     Temp 03/10/23 1549 98.4 F (36.9 C)     Temp Source 03/10/23 1549 Oral     SpO2 03/10/23 1549 100 %     Weight --      Height --      Head Circumference --      Peak Flow --      Pain Score 03/10/23 1550 7     Pain Loc --      Pain Education --      Exclude from Growth Chart --    No data found.  Updated Vital Signs BP (!) 153/80 (BP Location: Left Arm)   Pulse (!) 58   Temp 98.4 F (36.9 C) (Oral)   Resp 18   SpO2 100%   Physical Exam Vitals and nursing note reviewed.  Constitutional:      General: She is not in acute distress.    Appearance: Normal appearance. She is obese. She is ill-appearing.  HENT:     Head: Normocephalic and atraumatic.     Mouth/Throat:     Mouth: Mucous membranes are moist.     Pharynx: Oropharynx is clear.  Eyes:     Extraocular Movements: Extraocular movements intact.     Conjunctiva/sclera: Conjunctivae normal.     Pupils: Pupils are equal, round, and reactive to light.  Cardiovascular:     Rate and Rhythm: Normal rate and regular rhythm.     Pulses: Normal pulses.     Heart sounds: Normal heart sounds. No murmur heard.    No friction rub. No gallop.  Pulmonary:     Effort: Pulmonary effort is normal.     Breath sounds: Normal breath sounds. No wheezing, rhonchi or rales.  Abdominal:     Tenderness: There is no right CVA tenderness or left CVA tenderness.   Musculoskeletal:        General: Normal range of motion.     Cervical back: Normal range of motion and neck supple.  Skin:  General: Skin is warm and dry.  Neurological:     General: No focal deficit present.     Mental Status: She is alert and oriented to person, place, and time. Mental status is at baseline.  Psychiatric:        Mood and Affect: Mood normal.        Behavior: Behavior normal.      UC Treatments / Results  Labs (all labs ordered are listed, but only abnormal results are displayed) Labs Reviewed  POCT URINALYSIS DIP (MANUAL ENTRY) - Abnormal; Notable for the following components:      Result Value   Bilirubin, UA small (*)    Ketones, POC UA >= (160) (*)    Spec Grav, UA >=1.030 (*)    Blood, UA trace-intact (*)    Protein Ur, POC =30 (*)    All other components within normal limits  URINE CULTURE    EKG   Radiology No results found.  Procedures Procedures (including critical care time)  Medications Ordered in UC Medications - No data to display  Initial Impression / Assessment and Plan / UC Course  I have reviewed the triage vital signs and the nursing notes.  Pertinent labs & imaging results that were available during my care of the patient were reviewed by me and considered in my medical decision making (see chart for details).     MDM: 1.  Dehydration-UA revealed above with ketones>=160; 2.  Hematuria (with hematuria 1 day concerning for stone).  Advised patient and husband to go to [facility] ED NOW for IV fluids/rehydration patient/husband agreed and verbalized understanding of these instructions and this plan of care today. Final Clinical Impressions(s) / UC Diagnoses   Final diagnoses:  Dehydration  Hematuria, unspecified type     Discharge Instructions      Advised patient/husband go to [facility] ED NOW for IV fluids.     ED Prescriptions   None    PDMP not  reviewed this encounter.   Trevor Iha, FNP 03/10/23 1634    Trevor Iha, FNP 03/10/23 [phone]

## 2023-03-10 NOTE — Discharge Instructions (Addendum)
Advised patient/husband go to [facility] ED NOW for IV fluids.

## 2023-03-10 NOTE — ED Notes (Signed)
Patient is being discharged from the Urgent Care and sent to the Emergency Department via POV. Per Trevor Iha FNP, patient is in need of higher level of care due to diarrhea and dehydration. Patient is aware and verbalizes understanding of plan of care.  Vitals:   03/10/23 1549  BP: (!) 153/80  Pulse: (!) 58  Resp: 18  Temp: 98.4 F (36.9 C)  SpO2: 100%

## 2023-03-10 NOTE — ED Triage Notes (Signed)
Pt c/o intermittent diarrhea since 12/31. Was somewhat improved until Monday. Was dx with a parasite on 02/24/23. Currently on a treatment of bactrim and a couple probiotics.
